# Patient Record
Sex: Female | Born: 1970
Health system: Southern US, Community
[De-identification: ages and names within clinical notes are randomized; demographics above are authoritative.]

## PROBLEM LIST (undated history)

## (undated) DIAGNOSIS — T7840XA Allergy, unspecified, initial encounter: Secondary | ICD-10-CM

## (undated) DIAGNOSIS — T783XXA Angioneurotic edema, initial encounter: Secondary | ICD-10-CM

## (undated) HISTORY — DX: Angioneurotic edema, initial encounter: T78.3XXA

## (undated) HISTORY — DX: Allergy, unspecified, initial encounter: T78.40XA

---

## 2012-11-12 ENCOUNTER — Ambulatory Visit: Payer: Self-pay

## 2012-12-31 ENCOUNTER — Ambulatory Visit: Payer: Self-pay

## 2014-01-27 ENCOUNTER — Ambulatory Visit: Payer: Self-pay

## 2016-08-22 ENCOUNTER — Other Ambulatory Visit: Payer: Self-pay | Admitting: Advanced Practice Midwife

## 2016-08-22 DIAGNOSIS — Z1231 Encounter for screening mammogram for malignant neoplasm of breast: Secondary | ICD-10-CM

## 2016-09-21 ENCOUNTER — Ambulatory Visit
Admission: RE | Admit: 2016-09-21 | Discharge: 2016-09-21 | Disposition: A | Discharge status: Home / Self Care | Payer: 59 | Source: Ambulatory Visit | Attending: Advanced Practice Midwife | Admitting: Advanced Practice Midwife

## 2016-09-21 ENCOUNTER — Encounter: Payer: Self-pay | Admitting: Radiology

## 2016-09-21 DIAGNOSIS — Z1231 Encounter for screening mammogram for malignant neoplasm of breast: Secondary | ICD-10-CM | POA: Diagnosis not present

## 2017-10-14 ENCOUNTER — Ambulatory Visit: Payer: Self-pay | Admitting: Physician Assistant

## 2017-11-01 ENCOUNTER — Ambulatory Visit
Admission: RE | Admit: 2017-11-01 | Discharge: 2017-11-01 | Disposition: A | Discharge status: Home / Self Care | Payer: 59 | Source: Ambulatory Visit | Attending: Physician Assistant | Admitting: Physician Assistant

## 2017-11-01 ENCOUNTER — Ambulatory Visit (INDEPENDENT_AMBULATORY_CARE_PROVIDER_SITE_OTHER): Payer: 59 | Admitting: Physician Assistant

## 2017-11-01 ENCOUNTER — Encounter: Payer: Self-pay | Admitting: Physician Assistant

## 2017-11-01 VITALS — BP 110/80 | HR 92 | Temp 98.6°F | Resp 16 | Ht 59.0 in | Wt 150.0 lb

## 2017-11-01 DIAGNOSIS — Z136 Encounter for screening for cardiovascular disorders: Secondary | ICD-10-CM

## 2017-11-01 DIAGNOSIS — Z1231 Encounter for screening mammogram for malignant neoplasm of breast: Secondary | ICD-10-CM | POA: Diagnosis not present

## 2017-11-01 DIAGNOSIS — Z833 Family history of diabetes mellitus: Secondary | ICD-10-CM

## 2017-11-01 DIAGNOSIS — Z124 Encounter for screening for malignant neoplasm of cervix: Secondary | ICD-10-CM | POA: Diagnosis not present

## 2017-11-01 DIAGNOSIS — Z6830 Body mass index (BMI) 30.0-30.9, adult: Secondary | ICD-10-CM | POA: Diagnosis not present

## 2017-11-01 DIAGNOSIS — A599 Trichomoniasis, unspecified: Secondary | ICD-10-CM

## 2017-11-01 DIAGNOSIS — Z0000 Encounter for general adult medical examination without abnormal findings: Secondary | ICD-10-CM | POA: Diagnosis not present

## 2017-11-01 DIAGNOSIS — Z114 Encounter for screening for human immunodeficiency virus [HIV]: Secondary | ICD-10-CM | POA: Diagnosis not present

## 2017-11-01 DIAGNOSIS — Z1239 Encounter for other screening for malignant neoplasm of breast: Secondary | ICD-10-CM

## 2017-11-01 DIAGNOSIS — Z1329 Encounter for screening for other suspected endocrine disorder: Secondary | ICD-10-CM | POA: Diagnosis not present

## 2017-11-01 DIAGNOSIS — Z13220 Encounter for screening for lipoid disorders: Secondary | ICD-10-CM | POA: Diagnosis not present

## 2017-11-01 NOTE — Patient Instructions (Signed)

## 2017-11-01 NOTE — Progress Notes (Signed)
Patient: Tina Atkinson, Female    DOB: 1970-10-16, 47 y.o.   MRN: [id number] Visit Date: 11/01/2017  Today's Provider: Margaretann Loveless, PA-C   Chief Complaint  Patient presents with  . New Patient (Initial Visit)  . Annual Exam   Subjective:   Patient here to establish care. Patient denies any previous PCP.   Annual physical exam Tina Atkinson is a 47 y.o. female who presents today for health maintenance and complete physical. She feels well. She reports exercising 2 days per week. She reports she is sleeping well.  09/21/16 Mammogram-BI-RADS 1 02/2016 Pap-per pt at [facility] -----------------------------------------------------------------   Review of Systems  Constitutional: Negative.   HENT: Positive for sinus pressure, sneezing and sore throat.   Eyes: Positive for redness and itching.  Respiratory: Positive for cough.   Cardiovascular: Negative.   Gastrointestinal: Negative.   Endocrine: Negative.   Genitourinary: Negative.   Musculoskeletal: Positive for arthralgias.  Skin: Negative.   Allergic/Immunologic: Negative.   Neurological: Positive for light-headedness.  Hematological: Negative.   Psychiatric/Behavioral: Negative.     Social History      She  reports that she has never smoked. She has never used smokeless tobacco.       Social History   Socioeconomic History  . Marital status: Single    Spouse name: Not on file  . Number of children: Not on file  . Years of education: Not on file  . Highest education level: Not on file  Occupational History  . Not on file  Social Needs  . Financial resource strain: Not on file  . Food insecurity:    Worry: Not on file    Inability: Not on file  . Transportation needs:    Medical: Not on file    Non-medical: Not on file  Tobacco Use  . Smoking status: Never Smoker  . Smokeless tobacco: Never Used  Substance and Sexual Activity  . Alcohol use: Not on file  . Drug use: Not on file  . Sexual  activity: Not on file  Lifestyle  . Physical activity:    Days per week: Not on file    Minutes per session: Not on file  . Stress: Not on file  Relationships  . Social connections:    Talks on phone: Not on file    Gets together: Not on file    Attends religious service: Not on file    Active member of club or organization: Not on file    Attends meetings of clubs or organizations: Not on file    Relationship status: Not on file  Other Topics Concern  . Not on file  Social History Narrative  . Not on file    History reviewed. No pertinent past medical history.   There are no active problems to display for this patient.   History reviewed. No pertinent surgical history.  Family History        Family Status  Relation Name Status  . Mother  Alive  . Father  Alive  . MGM  (Not Specified)  . MGF  (Not Specified)  . Brother  Alive  . Brother  Alive  . Neg Hx  (Not Specified)        Her family history includes Cancer in her brother; Diabetes in her maternal grandfather, maternal grandmother, and mother; Hyperlipidemia in her father; Osteoporosis in her mother; Pancreatic cancer in her maternal grandfather. There is no history of Breast cancer.  Allergies  Allergen Reactions  . Penicillin G Hives    No current outpatient medications on file.   Patient Care Team: Margaretann Loveless, PA-C as PCP - General (Family Medicine)      Objective:   Vitals: BP 110/80 (BP Location: Left Arm, Patient Position: Sitting, Cuff Size: Normal)   Pulse 92   Temp 98.6 F (37 C) (Oral)   Resp 16   Ht  (1.499 m)   Wt 150 lb (68 kg)   SpO2 97%   BMI 30.30 kg/m    Vitals:   11/01/17 1021  BP: 110/80  Pulse: 92  Resp: 16  Temp: 98.6 F (37 C)  TempSrc: Oral  SpO2: 97%  Weight: 150 lb (68 kg)  Height:  (1.499 m)     Physical Exam  Constitutional: She is oriented to person, place, and time. She appears well-developed and well-nourished. No distress.    HENT:  Head: Normocephalic and atraumatic.  Right Ear: Hearing, tympanic membrane, external ear and ear canal normal.  Left Ear: Hearing, tympanic membrane, external ear and ear canal normal.  Nose: Nose normal.  Mouth/Throat: Uvula is midline, oropharynx is clear and moist and mucous membranes are normal. No oropharyngeal exudate.  Eyes: Pupils are equal, round, and reactive to light. Conjunctivae and EOM are normal. Right eye exhibits no discharge. Left eye exhibits no discharge. No scleral icterus.  Neck: Normal range of motion. Neck supple. No JVD present. Carotid bruit is not present. No tracheal deviation present. No thyromegaly present.  Cardiovascular: Normal rate, regular rhythm, normal heart sounds and intact distal pulses. Exam reveals no gallop and no friction rub.  No murmur heard. Pulmonary/Chest: Effort normal and breath sounds normal. No respiratory distress. She has no wheezes. She has no rales. She exhibits no tenderness. Right breast exhibits no inverted nipple, no mass, no nipple discharge, no skin change and no tenderness. Left breast exhibits no inverted nipple, no mass, no nipple discharge, no skin change and no tenderness. No breast tenderness, discharge or bleeding. Breasts are symmetrical.  Abdominal: Soft. Bowel sounds are normal. She exhibits no distension and no mass. There is no tenderness. There is no rebound and no guarding. Hernia confirmed negative in the right inguinal area and confirmed negative in the left inguinal area.  Genitourinary: Rectum normal, vagina normal and uterus normal. No breast tenderness, discharge or bleeding. Pelvic exam was performed with patient supine. There is no rash, tenderness, lesion or injury on the right labia. There is no rash, tenderness, lesion or injury on the left labia. Cervix exhibits no motion tenderness, no discharge and no friability. Right adnexum displays no mass, no tenderness and no fullness. Left adnexum displays no mass,  no tenderness and no fullness. No erythema, tenderness or bleeding in the vagina. No signs of injury around the vagina. No vaginal discharge found.  Musculoskeletal: Normal range of motion. She exhibits no edema or tenderness.  Lymphadenopathy:    She has no cervical adenopathy.       Right: No inguinal adenopathy present.       Left: No inguinal adenopathy present.  Neurological: She is alert and oriented to person, place, and time. She has normal reflexes. No cranial nerve deficit. Coordination normal.  Skin: Skin is warm and dry. No rash noted. She is not diaphoretic.  Psychiatric: She has a normal mood and affect. Her behavior is normal. Judgment and thought content normal.  Vitals reviewed.    Depression Screen PHQ 2/9 Scores 11/01/2017  PHQ - 2 Score 0  PHQ- 9 Score 1      Assessment & Plan:     Routine Health Maintenance and Physical Exam  Exercise Activities and Dietary recommendations Goals    None       There is no immunization history on file for this patient.  Health Maintenance  Topic Date Due  . HIV Screening  02/20/1985  . TETANUS/TDAP  02/20/1989  . PAP SMEAR  02/21/1991  . INFLUENZA VACCINE  01/16/2018     Discussed health benefits of physical activity, and encouraged her to engage in regular exercise appropriate for her age and condition.    1. Annual physical exam Normal physical exam today. Will check labs as below and f/u pending lab results. If labs are stable and WNL she will not need to have these rechecked for one year at her next annual physical exam. She is to call the office in the meantime if she has any acute issue, questions or concerns. - CBC with Differential/Platelet - Comprehensive metabolic panel - Lipid Panel With LDL/HDL Ratio - TSH - HgB Z6X  2. Screening for breast cancer Breast exam today was normal. There is no family history of breast cancer. She does perform regular self breast exams. Mammogram was ordered as below.  Information for [facility] clinic was given to patient so she may schedule her mammogram at her convenience. - MM DIGITAL SCREENING BILATERAL; Future  3. Cervical cancer screening Pap collected today. Will send as below and f/u pending results. - Pap IG and HPV (high risk) DNA detection  4. BMI 30.0-30.9,adult Counseled patient on healthy lifestyle modifications including dieting and exercise.  - CBC with Differential/Platelet - Comprehensive metabolic panel - Lipid Panel With LDL/HDL Ratio - HgB A1c  5. Encounter for lipid screening for cardiovascular disease Will check labs as below and f/u pending results. - Lipid Panel With LDL/HDL Ratio  6. Thyroid disorder screening Will check labs as below and f/u pending results. - TSH  7. Family history of diabetes mellitus Will check labs as below and f/u pending results. - HgB A1c  8. Screening for HIV without presence of risk factors - HIV antibody  --------------------------------------------------------------------    Margaretann Loveless, PA-C  [facility]

## 2017-11-02 LAB — CBC WITH DIFFERENTIAL/PLATELET
BASOS: 1 %
Basophils Absolute: 0 10*3/uL (ref 0.0–0.2)
EOS (ABSOLUTE): 0 10*3/uL (ref 0.0–0.4)
EOS: 1 %
HEMATOCRIT: 39.7 % (ref 34.0–46.6)
Hemoglobin: 13.6 g/dL (ref 11.1–15.9)
Immature Grans (Abs): 0 10*3/uL (ref 0.0–0.1)
Immature Granulocytes: 0 %
LYMPHS ABS: 2.6 10*3/uL (ref 0.7–3.1)
Lymphs: 44 %
MCH: 30.7 pg (ref 26.6–33.0)
MCHC: 34.3 g/dL (ref 31.5–35.7)
MCV: 90 fL (ref 79–97)
Monocytes Absolute: 0.3 10*3/uL (ref 0.1–0.9)
Monocytes: 6 %
Neutrophils Absolute: 2.9 10*3/uL (ref 1.4–7.0)
Neutrophils: 48 %
PLATELETS: 341 10*3/uL (ref 150–379)
RBC: 4.43 x10E6/uL (ref 3.77–5.28)
RDW: 13 % (ref 12.3–15.4)
WBC: 5.9 10*3/uL (ref 3.4–10.8)

## 2017-11-02 LAB — HEMOGLOBIN A1C
Est. average glucose Bld gHb Est-mCnc: 111 mg/dL
Hgb A1c MFr Bld: 5.5 % (ref 4.8–5.6)

## 2017-11-02 LAB — COMPREHENSIVE METABOLIC PANEL
A/G RATIO: 1.8 (ref 1.2–2.2)
ALK PHOS: 55 IU/L (ref 39–117)
ALT: 44 IU/L — ABNORMAL HIGH (ref 0–32)
AST: 33 IU/L (ref 0–40)
Albumin: 4.6 g/dL (ref 3.5–5.5)
BILIRUBIN TOTAL: 0.4 mg/dL (ref 0.0–1.2)
BUN/Creatinine Ratio: 9 (ref 9–23)
BUN: 8 mg/dL (ref 6–24)
CHLORIDE: 100 mmol/L (ref 96–106)
CO2: 23 mmol/L (ref 20–29)
Calcium: 10.1 mg/dL (ref 8.7–10.2)
Creatinine, Ser: 0.86 mg/dL (ref 0.57–1.00)
GFR calc Af Amer: 93 mL/min/{1.73_m2} (ref >59)
GFR calc non Af Amer: 81 mL/min/{1.73_m2} (ref >59)
GLOBULIN, TOTAL: 2.5 g/dL (ref 1.5–4.5)
Glucose: 85 mg/dL (ref 65–99)
POTASSIUM: 4.2 mmol/L (ref 3.5–5.2)
SODIUM: 138 mmol/L (ref 134–144)
Total Protein: 7.1 g/dL (ref 6.0–8.5)

## 2017-11-02 LAB — TSH: TSH: 1.96 u[IU]/mL (ref 0.450–4.500)

## 2017-11-02 LAB — LIPID PANEL WITH LDL/HDL RATIO
CHOLESTEROL TOTAL: 179 mg/dL (ref 100–199)
HDL: 48 mg/dL (ref >39)
LDL Calculated: 114 mg/dL — ABNORMAL HIGH (ref 0–99)
LDl/HDL Ratio: 2.4 ratio (ref 0.0–3.2)
Triglycerides: 87 mg/dL (ref 0–149)
VLDL Cholesterol Cal: 17 mg/dL (ref 5–40)

## 2017-11-02 LAB — HIV ANTIBODY (ROUTINE TESTING W REFLEX): HIV SCREEN 4TH GENERATION: NONREACTIVE

## 2017-11-04 ENCOUNTER — Telehealth: Payer: Self-pay

## 2017-11-04 NOTE — Telephone Encounter (Signed)
-----   Message from Margaretann Loveless, PA-C sent at 11/04/2017  9:00 AM EDT ----- All labs are within normal limits and stable.  Thanks! -JB

## 2017-11-04 NOTE — Telephone Encounter (Signed)
Left message to call back  

## 2017-11-04 NOTE — Telephone Encounter (Signed)
-----   Message from Margaretann Loveless, [place] sent at 11/01/2017  4:19 PM EDT ----- Normal mammogram. Repeat screening in one year.

## 2017-11-05 NOTE — Telephone Encounter (Signed)
Patient advised as below.  

## 2017-11-06 LAB — PAP IG AND HPV HIGH-RISK
HPV, high-risk: NEGATIVE
PAP Smear Comment: 0

## 2017-11-06 MED ORDER — METRONIDAZOLE 500 MG PO TABS: 2000.0000 mg | ORAL_TABLET | Freq: Once | ORAL | 0 refills | Status: AC

## 2017-11-06 NOTE — Addendum Note (Signed)
Addended by: Margaretann Loveless on: 11/06/2017 04:42 PM   Modules accepted: Orders

## 2017-11-12 ENCOUNTER — Emergency Department
Admission: EM | Admit: 2017-11-12 | Discharge: 2017-11-12 | Disposition: A | Discharge status: Home / Self Care | Payer: No Typology Code available for payment source | Attending: Student in an Organized Health Care Education/Training Program | Admitting: Student in an Organized Health Care Education/Training Program

## 2017-11-12 ENCOUNTER — Other Ambulatory Visit: Payer: Self-pay

## 2017-11-12 ENCOUNTER — Encounter: Payer: Self-pay | Admitting: Emergency Medicine

## 2017-11-12 DIAGNOSIS — Z77098 Contact with and (suspected) exposure to other hazardous, chiefly nonmedicinal, chemicals: Secondary | ICD-10-CM

## 2017-11-12 DIAGNOSIS — R51 Headache: Secondary | ICD-10-CM | POA: Diagnosis present

## 2017-11-12 DIAGNOSIS — T5991XA Toxic effect of unspecified gases, fumes and vapors, accidental (unintentional), initial encounter: Secondary | ICD-10-CM | POA: Insufficient documentation

## 2017-11-12 DIAGNOSIS — R42 Dizziness and giddiness: Secondary | ICD-10-CM | POA: Diagnosis not present

## 2017-11-12 LAB — BASIC METABOLIC PANEL
Anion gap: 8 (ref 5–15)
BUN: 9 mg/dL (ref 6–20)
CHLORIDE: 101 mmol/L (ref 101–111)
CO2: 26 mmol/L (ref 22–32)
Calcium: 9.5 mg/dL (ref 8.9–10.3)
Creatinine, Ser: 0.96 mg/dL (ref 0.44–1.00)
GFR calc Af Amer: >60 mL/min (ref >60)
GFR calc non Af Amer: >60 mL/min (ref >60)
GLUCOSE: 108 mg/dL — AB (ref 65–99)
POTASSIUM: 3.8 mmol/L (ref 3.5–5.1)
Sodium: 135 mmol/L (ref 135–145)

## 2017-11-12 LAB — CBC
HCT: 40 % (ref 35.0–47.0)
HEMOGLOBIN: 13.9 g/dL (ref 12.0–16.0)
MCH: 31.8 pg (ref 26.0–34.0)
MCHC: 34.9 g/dL (ref 32.0–36.0)
MCV: 91.2 fL (ref 80.0–100.0)
PLATELETS: 288 10*3/uL (ref 150–440)
RBC: 4.38 MIL/uL (ref 3.80–5.20)
RDW: 13.1 % (ref 11.5–14.5)
WBC: 6.1 10*3/uL (ref 3.6–11.0)

## 2017-11-12 NOTE — Discharge Instructions (Signed)
If symptoms return when you go back to work tomorrow notify supervisor and immediate leave the area.

## 2017-11-12 NOTE — ED Notes (Signed)
Tina Atkinson says no uds needed for wc

## 2017-11-12 NOTE — ED Notes (Signed)
See triage note  Presents from work with possible gas exposure  States she became dizzy and had slight headache  States sx's have eased off since arrival to ED

## 2017-11-12 NOTE — ED Notes (Signed)
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  

## 2017-11-12 NOTE — ED Provider Notes (Signed)
[facility] Emergency Department Provider Note   ____________________________________________   First MD Initiated Contact with Patient 11/12/17 1425     (approximate)  I have reviewed the triage vital signs and the nursing notes.   HISTORY  Chief Complaint Toxic Inhalation; Dizziness; and Headache    HPI Tina Atkinson is a 47 y.o. female patient complain of headache and dizziness secondary to toxic inhalation at work.  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  Patient is here with other coworkers from her work site.  History reviewed. No pertinent past medical history.  There are no active problems to display for this patient.   History reviewed. No pertinent surgical history.  Prior to Admission medications   Not on File    Allergies Penicillin g  Family History  Problem Relation Age of Onset  . Diabetes Mother   . Osteoporosis Mother   . Hyperlipidemia Father   . Diabetes Maternal Grandmother   . Diabetes Maternal Grandfather   . Pancreatic cancer Maternal Grandfather   . Cancer Brother   . Breast cancer Neg Hx     Social History Social History   Tobacco Use  . Smoking status: Never Smoker  . Smokeless tobacco: Never Used  Substance Use Topics  . Alcohol use: Not on file  . Drug use: Not on file    Review of Systems Constitutional: No fever/chills Eyes: No visual changes. ENT: No sore throat. Cardiovascular: Denies chest pain. Respiratory: Denies shortness of breath. Gastrointestinal: No abdominal pain.  No nausea, no vomiting.  No diarrhea.  No constipation. Genitourinary: Negative for dysuria. Musculoskeletal: Negative for back pain. Skin: Negative for rash. Neurological: Negative for headaches, focal weakness or numbness. Hematological/Lymphatic: Allergic/Immunilogical: Penicillin.  ____________________________________________   PHYSICAL EXAM:  VITAL SIGNS: ED Triage Vitals    Enc Vitals Group     BP 11/12/17 1356 139/79     Pulse Rate 11/12/17 1356 (!) 102     Resp 11/12/17 1356 14     Temp 11/12/17 1356 98.9 F (37.2 C)     Temp Source 11/12/17 1356 Oral     SpO2 11/12/17 1356 99 %     Weight 11/12/17 1357 150 lb (68 kg)     Height 11/12/17 1357  (1.499 m)     Head Circumference --      Peak Flow --      Pain Score 11/12/17 1357 4     Pain Loc --      Pain Edu? --      Excl. in GC? --    Constitutional: Alert and oriented. Well appearing and in no acute distress. Eyes: Conjunctivae are normal. PERRL. EOMI. Nose: No congestion/rhinnorhea. Mouth/Throat: Mucous membranes are moist.  Oropharynx non-erythematous. Neck: No stridor.   Cardiovascular: Normal rate, regular rhythm. Grossly normal heart sounds.  Good peripheral circulation. Respiratory: Normal respiratory effort.  No retractions. Lungs CTAB. Musculoskeletal: No lower extremity tenderness nor edema.  No joint effusions. Neurologic:  Normal speech and language. No gross focal neurologic deficits are appreciated. No gait instability. Skin:  Skin is warm, dry and intact. No rash noted. Psychiatric: Mood and affect are normal. Speech and behavior are normal.  ____________________________________________   LABS (all labs ordered are listed, but only abnormal results are displayed)  Labs Reviewed  BASIC METABOLIC PANEL - Abnormal; Notable for the following components:      Result Value   Glucose, Bld 108 (*)    All  other components within normal limits  CBC   ____________________________________________  EKG  EKG read by Dr. Roxan Hockey with no acute findings. ____________________________________________  RADIOLOGY    Official radiology report(s): No results found.  ____________________________________________   PROCEDURES  Procedure(s) performed: None  Procedures  Critical Care performed: No  ____________________________________________   INITIAL IMPRESSION /  ASSESSMENT AND PLAN / ED COURSE  As part of my medical decision making, I reviewed the following data within the electronic MEDICAL RECORD NUMBER    Headache and mild vertigo secondary to chemical exposure.  Discussed lab results with patient.  Patient given discharge care instructions.  Patient will follow-up with this department if symptoms return we should go back to work.      ____________________________________________   FINAL CLINICAL IMPRESSION(S) / ED DIAGNOSES  Final diagnoses:  Exposure to chemical inhalation     ED Discharge Orders    None       Note:  This document was prepared using Dragon voice recognition software and may include unintentional dictation errors.    Joni Reining, PA-C 11/12/17 1557    Willy Eddy, MD 11/13/17 [id number]

## 2017-11-12 NOTE — ED Triage Notes (Signed)
Says exposure to something at work along with 8 other patients brought here.  headhache slight, and dizziness.

## 2018-12-10 ENCOUNTER — Other Ambulatory Visit: Payer: Self-pay | Admitting: Physician Assistant

## 2018-12-10 DIAGNOSIS — Z1231 Encounter for screening mammogram for malignant neoplasm of breast: Secondary | ICD-10-CM

## 2019-01-16 ENCOUNTER — Ambulatory Visit
Admission: RE | Admit: 2019-01-16 | Discharge: 2019-01-16 | Disposition: A | Discharge status: Home / Self Care | Payer: 59 | Source: Ambulatory Visit | Attending: Physician Assistant | Admitting: Physician Assistant

## 2019-01-16 ENCOUNTER — Other Ambulatory Visit: Payer: Self-pay

## 2019-01-16 DIAGNOSIS — Z1231 Encounter for screening mammogram for malignant neoplasm of breast: Secondary | ICD-10-CM | POA: Insufficient documentation

## 2019-01-19 ENCOUNTER — Telehealth: Payer: Self-pay

## 2019-01-19 NOTE — Telephone Encounter (Signed)
-----   Message from Mar Daring, [place] sent at 01/16/2019  3:20 PM EDT ----- Normal mammogram. Repeat screening in one year.

## 2019-01-19 NOTE — Telephone Encounter (Signed)
Patient advised as directed below. 

## 2019-06-17 IMAGING — MG MM DIGITAL SCREENING BILAT W/ TOMO W/ CAD
6 of 10 series · 6 of 30 positions shown · non-contrast
Comparison: Previous exam(s).

CLINICAL DATA: Screening.

EXAM:
DIGITAL SCREENING BILATERAL MAMMOGRAM WITH TOMO AND CAD

[L CC synth-2D]
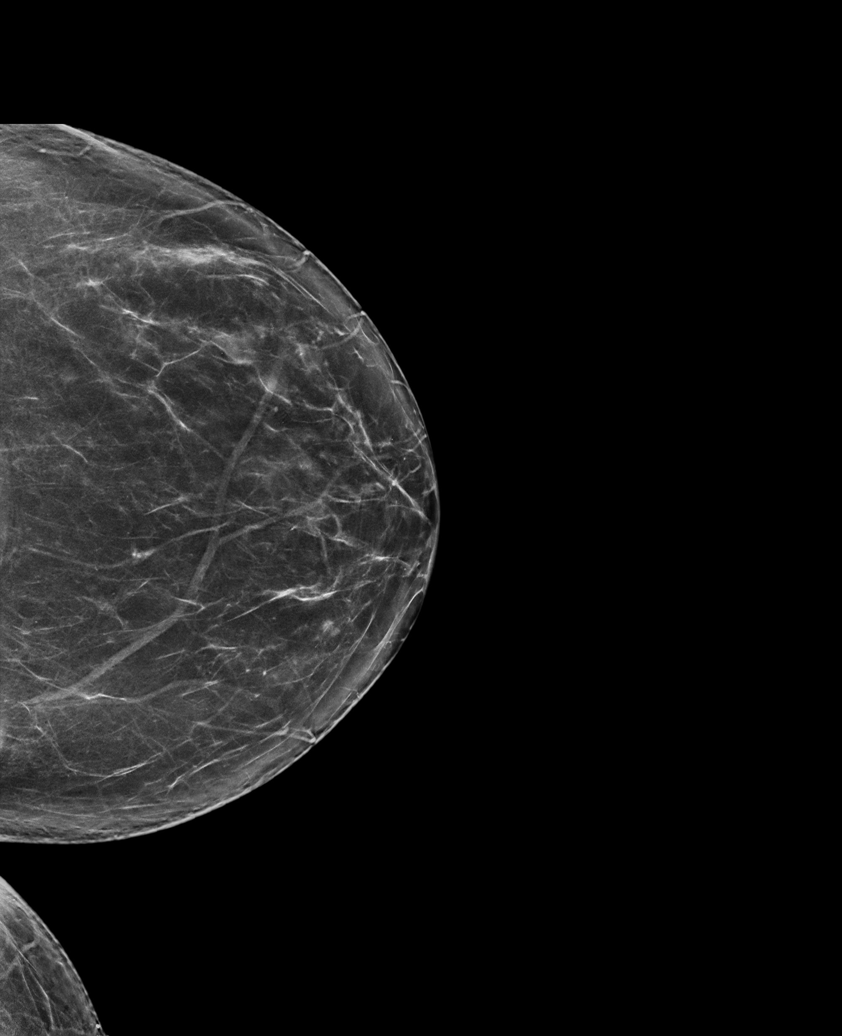

[R MLO synth-2D (1 of 2)]
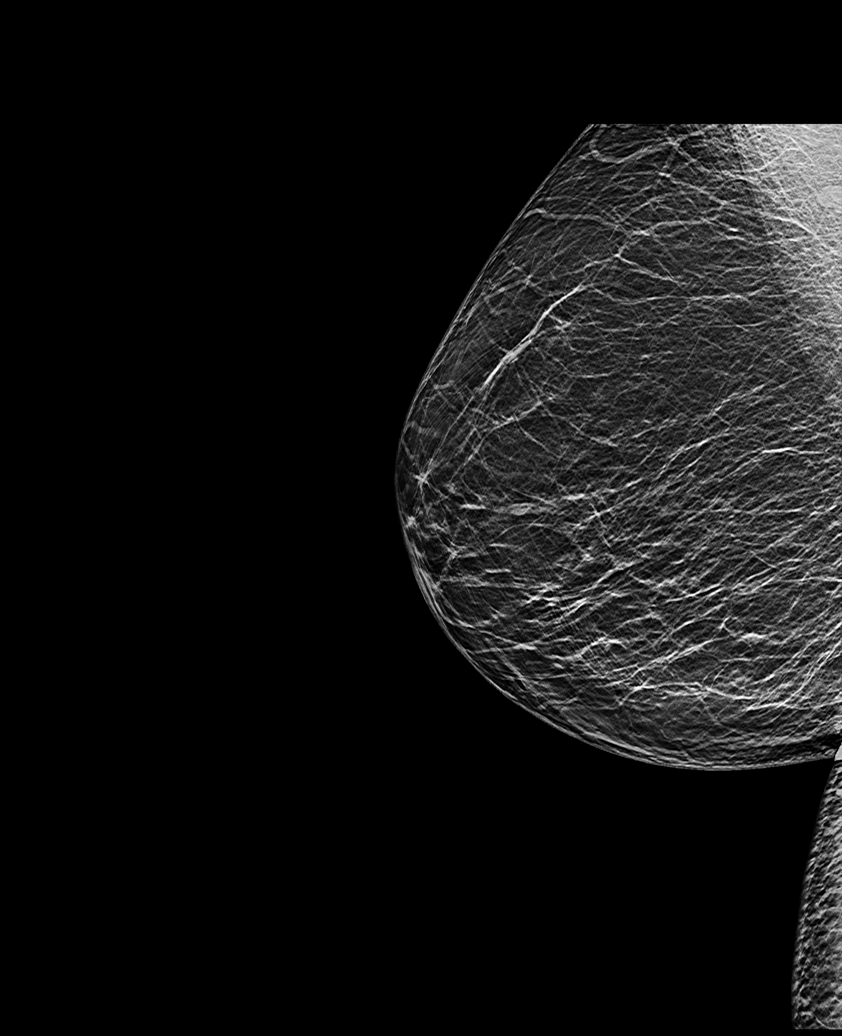

[R CC synth-2D]
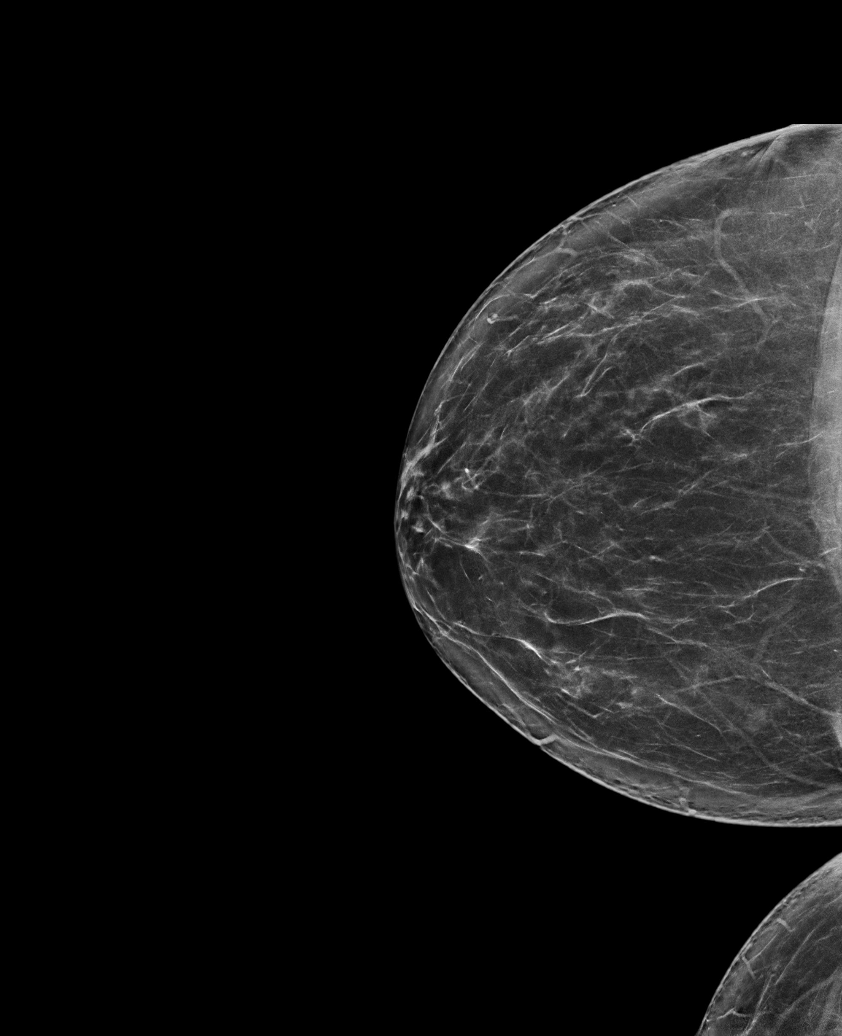

[R MLO synth-2D (2 of 2)]
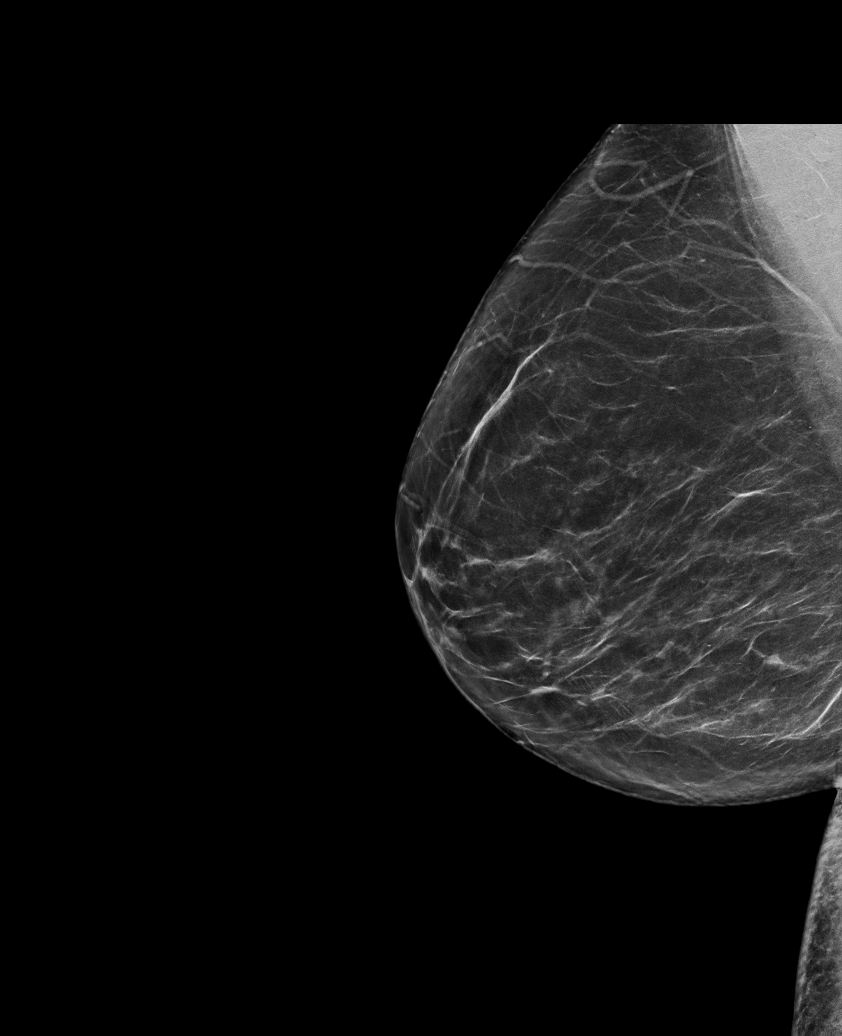

[L MLO synth-2D]
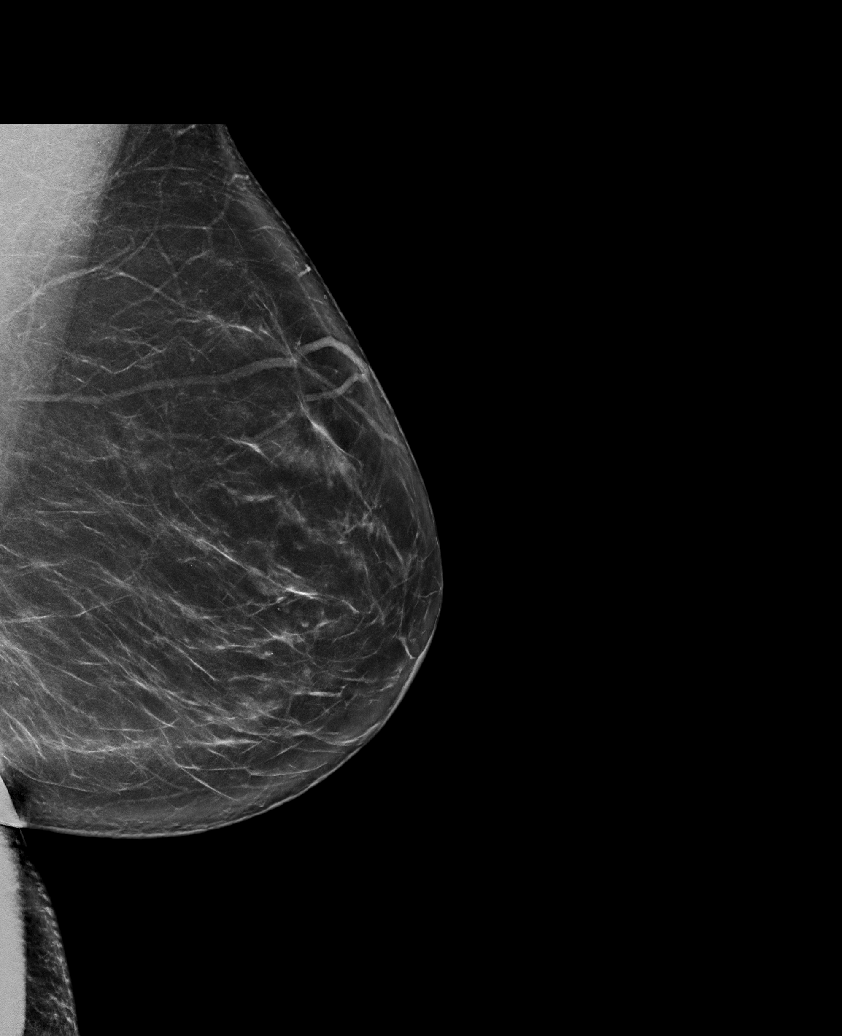

[R CC tomo · tomo slice 39/78.0]
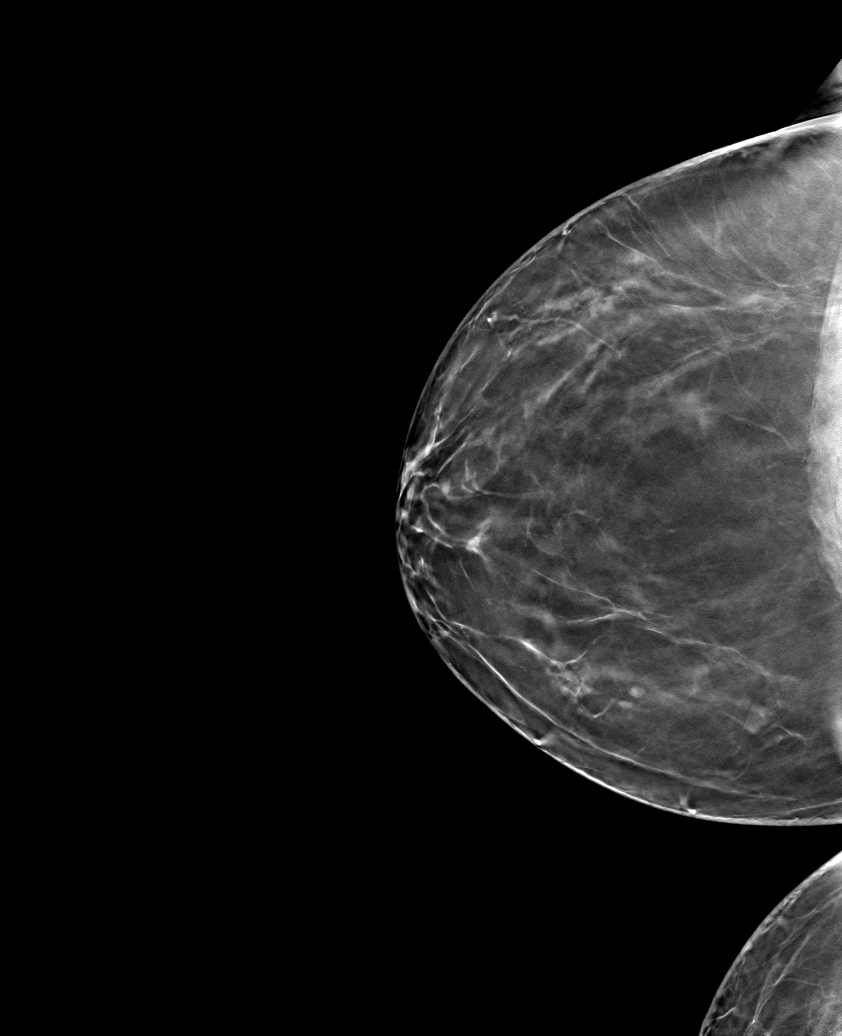

[6 of 30 positions shown; findings below may reference images not displayed]

ACR Breast Density Category b: There are scattered areas of
fibroglandular density.
FINDINGS: There are no findings suspicious for malignancy. Images were
processed with CAD.
IMPRESSION: No mammographic evidence of malignancy. A result letter of this
screening mammogram will be mailed directly to the patient.

RECOMMENDATION:
Screening mammogram in one year. (Code:[id number])

BI-RADS CATEGORY  1: Negative.

## 2019-12-14 ENCOUNTER — Other Ambulatory Visit: Payer: Self-pay | Admitting: Physician Assistant

## 2019-12-14 ENCOUNTER — Telehealth: Payer: Self-pay

## 2019-12-14 ENCOUNTER — Other Ambulatory Visit: Payer: Self-pay

## 2019-12-14 DIAGNOSIS — Z1231 Encounter for screening mammogram for malignant neoplasm of breast: Secondary | ICD-10-CM

## 2019-12-14 NOTE — Telephone Encounter (Signed)
Copied from CRM [id number]. Topic: General - Other >> Dec 14, 2019  8:40 AM Elliot Gault wrote: Reason for CRM: patient requesting annual mamo orders please send to [facility]

## 2020-01-19 ENCOUNTER — Other Ambulatory Visit: Payer: Self-pay

## 2020-01-19 ENCOUNTER — Ambulatory Visit
Admission: RE | Admit: 2020-01-19 | Discharge: 2020-01-19 | Disposition: A | Discharge status: Home / Self Care | Payer: 59 | Source: Ambulatory Visit | Attending: Physician Assistant | Admitting: Physician Assistant

## 2020-01-19 DIAGNOSIS — Z1231 Encounter for screening mammogram for malignant neoplasm of breast: Secondary | ICD-10-CM | POA: Insufficient documentation

## 2021-02-14 ENCOUNTER — Ambulatory Visit: Payer: 59 | Admitting: Family Medicine

## 2021-02-24 ENCOUNTER — Other Ambulatory Visit: Payer: Self-pay

## 2021-02-24 ENCOUNTER — Ambulatory Visit (INDEPENDENT_AMBULATORY_CARE_PROVIDER_SITE_OTHER): Payer: 59 | Admitting: Family Medicine

## 2021-02-24 ENCOUNTER — Other Ambulatory Visit (HOSPITAL_COMMUNITY)
Admission: RE | Admit: 2021-02-24 | Discharge: 2021-02-24 | Disposition: A | Discharge status: Home / Self Care | Payer: 59 | Source: Ambulatory Visit | Attending: Family Medicine | Admitting: Family Medicine

## 2021-02-24 ENCOUNTER — Encounter: Payer: Self-pay | Admitting: Family Medicine

## 2021-02-24 ENCOUNTER — Ambulatory Visit
Admission: RE | Admit: 2021-02-24 | Discharge: 2021-02-24 | Disposition: A | Discharge status: Home / Self Care | Payer: 59 | Source: Ambulatory Visit | Attending: Family Medicine | Admitting: Family Medicine

## 2021-02-24 VITALS — BP 123/83 | HR 90 | Temp 98.1°F | Resp 16 | Ht 59.0 in | Wt 146.9 lb

## 2021-02-24 DIAGNOSIS — E663 Overweight: Secondary | ICD-10-CM

## 2021-02-24 DIAGNOSIS — Z0000 Encounter for general adult medical examination without abnormal findings: Secondary | ICD-10-CM

## 2021-02-24 DIAGNOSIS — R739 Hyperglycemia, unspecified: Secondary | ICD-10-CM

## 2021-02-24 DIAGNOSIS — Z1231 Encounter for screening mammogram for malignant neoplasm of breast: Secondary | ICD-10-CM | POA: Diagnosis present

## 2021-02-24 DIAGNOSIS — Z1211 Encounter for screening for malignant neoplasm of colon: Secondary | ICD-10-CM | POA: Diagnosis not present

## 2021-02-24 DIAGNOSIS — Z23 Encounter for immunization: Secondary | ICD-10-CM

## 2021-02-24 DIAGNOSIS — Z124 Encounter for screening for malignant neoplasm of cervix: Secondary | ICD-10-CM

## 2021-02-24 DIAGNOSIS — Z1159 Encounter for screening for other viral diseases: Secondary | ICD-10-CM

## 2021-02-24 NOTE — Progress Notes (Signed)
Complete physical exam   Patient: Tina Atkinson   DOB: 10/26/1970   50 y.o. Female  MRN: [id number] Visit Date: 02/24/2021  Today's healthcare provider: Shirlee Latch, MD   Chief Complaint  Patient presents with   Annual Exam   Subjective    Tina Atkinson is a 50 y.o. female who presents today for a complete physical exam.  She reports consuming a general diet. The patient does not participate in regular exercise at present. She generally feels well. She reports sleeping well. She does have additional problems to discuss today.   HPI  Skin-tags She wanted to discuss the skin-tags around her neck. They bother her when they get caught on her jewelry   Screenings  11/01/17 Pap/HPV-positive for atypical squamous cells 01/19/20 Mammogram-BI-RADS 1 She is amenable to trying cologuard. She denies family Hx of colon cancer.  Vaccines Declines flu vaccine at this time.  Shingrix 02/24/21  History reviewed. No pertinent past medical history. History reviewed. No pertinent surgical history. Social History   Socioeconomic History   Marital status: Single    Spouse name: Not on file   Number of children: Not on file   Years of education: Not on file   Highest education level: Not on file  Occupational History   Not on file  Tobacco Use   Smoking status: Never   Smokeless tobacco: Never  Vaping Use   Vaping Use: Never used  Substance and Sexual Activity   Alcohol use: Not on file   Drug use: Not on file   Sexual activity: Not on file  Other Topics Concern   Not on file  Social History Narrative   Not on file   Social Determinants of Health   Financial Resource Strain: Not on file  Food Insecurity: Not on file  Transportation Needs: Not on file  Physical Activity: Not on file  Stress: Not on file  Social Connections: Not on file  Intimate Partner Violence: Not on file   Family Status  Relation Name Status   Mother  Alive   Father  Alive   MGM  (Not  Specified)   MGF  (Not Specified)   Brother  Alive   Brother  Alive   Neg Hx  (Not Specified)   Family History  Problem Relation Age of Onset   Diabetes Mother    Osteoporosis Mother    Hyperlipidemia Father    Diabetes Maternal Grandmother    Diabetes Maternal Grandfather    Pancreatic cancer Maternal Grandfather    Cancer Brother    Breast cancer Neg Hx    Allergies  Allergen Reactions   Penicillin G Hives    Patient Care Team: Erasmo Downer, MD as PCP - General (Family Medicine)   Medications: No outpatient medications prior to visit.   No facility-administered medications prior to visit.    Review of Systems  Constitutional:  Negative for chills, diaphoresis, fatigue and fever.  HENT:  Negative for ear pain, sinus pressure, sinus pain and sore throat.   Eyes:  Negative for pain and visual disturbance.  Respiratory:  Negative for apnea, cough, chest tightness, shortness of breath and wheezing.   Cardiovascular:  Negative for chest pain, palpitations and leg swelling.  Gastrointestinal:  Negative for abdominal pain, blood in stool, constipation, diarrhea, nausea and vomiting.  Genitourinary:  Negative for dysuria, flank pain, frequency, pelvic pain and urgency.  Musculoskeletal:  Negative for back pain, myalgias and neck pain.  Neurological:  Negative for dizziness,  seizures, syncope, weakness, light-headedness, numbness and headaches.  All other systems reviewed and are negative.    Objective    BP 123/83 (BP Location: Left Arm, Patient Position: Sitting, Cuff Size: Normal)   Pulse 90   Temp 98.1 F (36.7 C) (Oral)   Resp 16   Ht 4\' 11"  (1.499 m)   Wt 146 lb 14.4 oz (66.6 kg)   LMP 09/17/2016 (Within Days)   BMI 29.67 kg/m    Physical Exam Vitals reviewed.  Constitutional:      General: She is not in acute distress.    Appearance: Normal appearance. She is well-developed. She is not diaphoretic.  HENT:     Head: Normocephalic and atraumatic.      Right Ear: Tympanic membrane, ear canal and external ear normal.     Left Ear: Tympanic membrane, ear canal and external ear normal.     Nose: Nose normal.     Mouth/Throat:     Mouth: Mucous membranes are moist.     Pharynx: Oropharynx is clear. No oropharyngeal exudate.  Eyes:     General: No scleral icterus.    Conjunctiva/sclera: Conjunctivae normal.     Pupils: Pupils are equal, round, and reactive to light.  Neck:     Thyroid: No thyromegaly.  Cardiovascular:     Rate and Rhythm: Normal rate and regular rhythm.     Pulses: Normal pulses.     Heart sounds: Normal heart sounds. No murmur heard. Pulmonary:     Effort: Pulmonary effort is normal. No respiratory distress.     Breath sounds: Normal breath sounds. No wheezing or rales.  Chest:     Comments: Breasts: breasts appear normal, no suspicious masses, no skin or nipple changes or axillary nodes  Abdominal:     General: There is no distension.     Palpations: Abdomen is soft.     Tenderness: There is no abdominal tenderness.  Genitourinary:    Comments: GYN:  External genitalia within normal limits.  Vaginal mucosa pink, moist, normal rugae.  Nonfriable cervix without lesions, no discharge or bleeding noted on speculum exam.   Musculoskeletal:        General: No deformity.     Cervical back: Neck supple.     Right lower leg: No edema.     Left lower leg: No edema.  Lymphadenopathy:     Cervical: No cervical adenopathy.  Skin:    General: Skin is warm and dry.     Findings: No rash.  Neurological:     Mental Status: She is alert and oriented to person, place, and time. Mental status is at baseline.     Sensory: No sensory deficit.     Motor: No weakness.     Gait: Gait normal.  Psychiatric:        Mood and Affect: Mood normal.        Behavior: Behavior normal.        Thought Content: Thought content normal.      Last depression screening scores PHQ 2/9 Scores 02/24/2021 11/01/2017  PHQ - 2 Score 0 0  PHQ- 9  Score 0 1   Last fall risk screening Fall Risk  02/24/2021  Falls in the past year? 0  Number falls in past yr: 0  Injury with Fall? 0  Risk for fall due to : No Fall Risks  Follow up Falls evaluation completed   Last Audit-C alcohol use screening Alcohol Use Disorder Test (AUDIT) 02/24/2021  1. How  often do you have a drink containing alcohol? 1  2. How many drinks containing alcohol do you have on a typical day when you are drinking? 0  3. How often do you have six or more drinks on one occasion? 0  AUDIT-C Score 1   A score of 3 or more in women, and 4 or more in men indicates increased risk for alcohol abuse, EXCEPT if all of the points are from question 1   No results found for any visits on 02/24/21.  Assessment & Plan     Problem List Items Addressed This Visit       Other   Overweight    Discussed importance of healthy weight management Discussed diet and exercise       Other Visit Diagnoses     Encounter for annual health examination    -  Primary   Relevant Orders   Comprehensive metabolic panel   Lipid Panel With LDL/HDL Ratio   Hepatitis C Antibody   Cologuard   Hemoglobin A1c   Cervical cancer screening       Relevant Orders   Cytology - PAP   Screening mammogram, encounter for       Relevant Orders   MM 3D SCREEN BREAST BILATERAL   Colon cancer screening       Relevant Orders   Cologuard   Need for hepatitis C screening test       Relevant Orders   Hepatitis C Antibody   Hyperglycemia       Relevant Orders   Hemoglobin A1c       Routine Health Maintenance and Physical Exam  Exercise Activities and Dietary recommendations  Goals   None     Immunization History  Administered Date(s) Administered   Influenza,inj,Quad PF,6+ Mos 05/03/2017   Janssen (J&J) SARS-COV-2 Vaccination 09/24/2019   Moderna SARS-COV2 Booster Vaccination 04/12/2020    Health Maintenance  Topic Date Due   Hepatitis C Screening  Never done   TETANUS/TDAP  Never  done   COLONOSCOPY (Pts 45-58yrs Insurance coverage will need to be confirmed)  Never done   Zoster Vaccines- Shingrix (1 of 2) Never done   COVID-19 Vaccine (3 - Booster for Janssen series) 08/13/2020   PAP SMEAR-Modifier  11/01/2020   INFLUENZA VACCINE  09/15/2021 (Originally 01/16/2021)   MAMMOGRAM  01/18/2022   HIV Screening  Completed   Pneumococcal Vaccine 65-6 Years old  Aged Out   HPV VACCINES  Aged Out    Discussed health benefits of physical activity, and encouraged her to engage in regular exercise appropriate for her age and condition.    Return in about 1 year (around 02/24/2022) for CPE.     I,Essence Turner,acting as a Neurosurgeon for Shirlee Latch, MD.,have documented all relevant documentation on the behalf of Shirlee Latch, MD,as directed by  Shirlee Latch, MD while in the presence of Shirlee Latch, MD.  I, Shirlee Latch, MD, have reviewed all documentation for this visit. The documentation on 02/24/21 for the exam, diagnosis, procedures, and orders are all accurate and complete.   Anginette Espejo, Marzella Schlein, MD, MPH [facility]

## 2021-02-24 NOTE — Patient Instructions (Addendum)
  Also consider TDAP and COVID booster

## 2021-02-24 NOTE — Assessment & Plan Note (Signed)
Discussed importance of healthy weight management Discussed diet and exercise  

## 2021-02-24 NOTE — Addendum Note (Signed)
Addended by: Hyacinth Meeker on: 02/24/2021 10:41 AM   Modules accepted: Orders

## 2021-02-25 LAB — COMPREHENSIVE METABOLIC PANEL
ALT: 39 IU/L — ABNORMAL HIGH (ref 0–32)
AST: 27 IU/L (ref 0–40)
Albumin/Globulin Ratio: 1.8 (ref 1.2–2.2)
Albumin: 4.7 g/dL (ref 3.8–4.9)
Alkaline Phosphatase: 70 IU/L (ref 44–121)
BUN/Creatinine Ratio: 8 — ABNORMAL LOW (ref 9–23)
BUN: 7 mg/dL (ref 6–24)
Bilirubin Total: 0.4 mg/dL (ref 0.0–1.2)
CO2: 26 mmol/L (ref 20–29)
Calcium: 10.4 mg/dL — ABNORMAL HIGH (ref 8.7–10.2)
Chloride: 101 mmol/L (ref 96–106)
Creatinine, Ser: 0.92 mg/dL (ref 0.57–1.00)
Globulin, Total: 2.6 g/dL (ref 1.5–4.5)
Glucose: 102 mg/dL — ABNORMAL HIGH (ref 65–99)
Potassium: 4.6 mmol/L (ref 3.5–5.2)
Sodium: 139 mmol/L (ref 134–144)
Total Protein: 7.3 g/dL (ref 6.0–8.5)
eGFR: 75 mL/min/{1.73_m2} (ref >59)

## 2021-02-25 LAB — LIPID PANEL WITH LDL/HDL RATIO
Cholesterol, Total: 221 mg/dL — ABNORMAL HIGH (ref 100–199)
HDL: 46 mg/dL (ref >39)
LDL Chol Calc (NIH): 141 mg/dL — ABNORMAL HIGH (ref 0–99)
LDL/HDL Ratio: 3.1 ratio (ref 0.0–3.2)
Triglycerides: 187 mg/dL — ABNORMAL HIGH (ref 0–149)
VLDL Cholesterol Cal: 34 mg/dL (ref 5–40)

## 2021-02-25 LAB — HEMOGLOBIN A1C
Est. average glucose Bld gHb Est-mCnc: 117 mg/dL
Hgb A1c MFr Bld: 5.7 % — ABNORMAL HIGH (ref 4.8–5.6)

## 2021-02-25 LAB — HEPATITIS C ANTIBODY: Hep C Virus Ab: <0.1 s/co ratio (ref 0.0–0.9)

## 2021-03-02 ENCOUNTER — Telehealth: Payer: Self-pay

## 2021-03-02 LAB — CYTOLOGY - PAP
Comment: NEGATIVE
Diagnosis: NEGATIVE
High risk HPV: NEGATIVE

## 2021-03-02 NOTE — Telephone Encounter (Signed)
Copied from CRM [id number]. Topic: General - Other >> Mar 02, 2021  9:09 AM Traci Sermon wrote: Reason for CRM: Pt called in to return a call, I did not see any notes or messages as to who and why, but pt requested if someone give her a call back. Please advise.

## 2021-03-02 NOTE — Telephone Encounter (Signed)
Patient advised of normal mammogram and pap.

## 2021-03-12 LAB — COLOGUARD: Cologuard: NEGATIVE

## 2021-04-27 ENCOUNTER — Other Ambulatory Visit: Payer: Self-pay

## 2021-04-27 ENCOUNTER — Ambulatory Visit (INDEPENDENT_AMBULATORY_CARE_PROVIDER_SITE_OTHER): Payer: 59 | Admitting: Family Medicine

## 2021-04-27 DIAGNOSIS — Z23 Encounter for immunization: Secondary | ICD-10-CM

## 2021-04-28 ENCOUNTER — Encounter: Payer: Self-pay | Admitting: Family Medicine

## 2021-04-28 LAB — BASIC METABOLIC PANEL
BUN/Creatinine Ratio: 9 (ref 9–23)
BUN: 8 mg/dL (ref 6–24)
CO2: 25 mmol/L (ref 20–29)
Calcium: 10.3 mg/dL — ABNORMAL HIGH (ref 8.7–10.2)
Chloride: 101 mmol/L (ref 96–106)
Creatinine, Ser: 0.9 mg/dL (ref 0.57–1.00)
Glucose: 85 mg/dL (ref 70–99)
Potassium: 4 mmol/L (ref 3.5–5.2)
Sodium: 140 mmol/L (ref 134–144)
eGFR: 77 mL/min/{1.73_m2} (ref >59)

## 2021-04-28 NOTE — Progress Notes (Signed)
Patient here for shingrix vaccination only.  I did not examine the patient.  I did review her medical history, medications, and allergies and vaccine consent form.  CMA gave vaccination. Patient tolerated well.  Erasmo Downer, MD, MPH [facility] 04/28/2021 7:54 AM

## 2021-05-01 ENCOUNTER — Telehealth: Payer: Self-pay

## 2021-05-01 NOTE — Telephone Encounter (Signed)
Patient aware. Labs ordered. Place up front.

## 2021-05-01 NOTE — Telephone Encounter (Signed)
-----   Message from Erasmo Downer, MD sent at 04/28/2021  8:11 AM EST ----- Calcium is still slightly elevated, but improving.  Continue to hold Calcium supplements and hydrate well and recheck in 2 months.

## 2022-02-26 ENCOUNTER — Encounter: Payer: 59 | Admitting: Family Medicine

## 2022-03-28 ENCOUNTER — Telehealth: Payer: Self-pay

## 2022-03-28 DIAGNOSIS — Z1231 Encounter for screening mammogram for malignant neoplasm of breast: Secondary | ICD-10-CM

## 2022-03-28 NOTE — Telephone Encounter (Signed)
Referral for mammogram

## 2022-04-04 ENCOUNTER — Other Ambulatory Visit: Payer: Self-pay | Admitting: Family Medicine

## 2022-04-04 DIAGNOSIS — Z1231 Encounter for screening mammogram for malignant neoplasm of breast: Secondary | ICD-10-CM

## 2022-05-07 ENCOUNTER — Ambulatory Visit
Admission: RE | Admit: 2022-05-07 | Discharge: 2022-05-07 | Disposition: A | Discharge status: Home / Self Care | Payer: Managed Care, Other (non HMO) | Source: Ambulatory Visit | Attending: Family Medicine | Admitting: Family Medicine

## 2022-05-07 DIAGNOSIS — Z1231 Encounter for screening mammogram for malignant neoplasm of breast: Secondary | ICD-10-CM | POA: Insufficient documentation

## 2022-05-09 NOTE — Progress Notes (Signed)
Hi Lindee  Normal mammogram; repeat in 1 year.  Please let us know if you have any questions.  Thank you,  Merita Norton, FNP

## 2022-06-26 ENCOUNTER — Ambulatory Visit (INDEPENDENT_AMBULATORY_CARE_PROVIDER_SITE_OTHER): Payer: Managed Care, Other (non HMO) | Admitting: Family Medicine

## 2022-06-26 ENCOUNTER — Encounter: Payer: Self-pay | Admitting: Family Medicine

## 2022-06-26 VITALS — BP 136/87 | HR 80 | Temp 98.5°F | Wt 150.1 lb

## 2022-06-26 DIAGNOSIS — Z0000 Encounter for general adult medical examination without abnormal findings: Secondary | ICD-10-CM | POA: Diagnosis not present

## 2022-06-26 DIAGNOSIS — R7303 Prediabetes: Secondary | ICD-10-CM

## 2022-06-26 DIAGNOSIS — L301 Dyshidrosis [pompholyx]: Secondary | ICD-10-CM | POA: Diagnosis not present

## 2022-06-26 MED ORDER — TRIAMCINOLONE ACETONIDE 0.5 % EX OINT: 1.0000 | TOPICAL_OINTMENT | Freq: Two times a day (BID) | CUTANEOUS | 0 refills | Status: DC

## 2022-06-26 NOTE — Assessment & Plan Note (Signed)
Recommend low carb diet °Recheck A1c  °

## 2022-06-26 NOTE — Progress Notes (Signed)
Complete physical exam  Patient: Tina Atkinson   DOB: [birth year]   52 y.o. Female  MRN: [id number]  Subjective:    Chief Complaint  Patient presents with   Annual Exam    Tina Atkinson is a 52 y.o. female who presents today for a complete physical exam. She reports consuming a general diet. Walking daily. She generally feels well. She reports sleeping well. She does have additional problems to discuss today. Rash on hands and wrist or several weeks.   Most recent fall risk assessment:    06/26/2022    9:18 AM  Fall Risk   Falls in the past year? 0  Number falls in past yr: 0  Injury with Fall? 0     Most recent depression screenings:    06/26/2022    9:19 AM 06/26/2022    9:18 AM  PHQ 2/9 Scores  PHQ - 2 Score 0 0  PHQ- 9 Score 0         Patient Care Team: Erasmo Downer, MD as PCP - General (Family Medicine)   No outpatient medications prior to visit.   No facility-administered medications prior to visit.    Review of Systems  Skin:  Positive for rash.  All other systems reviewed and are negative.      Objective:     BP 136/87   Pulse 80   Temp 98.5 F (36.9 C)   Wt 150 lb 1.6 oz (68.1 kg)   LMP 09/17/2016 (Within Days)   SpO2 100%   BMI 30.32 kg/m    Physical Exam Vitals reviewed.  Constitutional:      General: She is not in acute distress.    Appearance: Normal appearance. She is well-developed. She is not diaphoretic.  HENT:     Head: Normocephalic and atraumatic.     Right Ear: Tympanic membrane, ear canal and external ear normal.     Left Ear: Tympanic membrane, ear canal and external ear normal.     Nose: Nose normal.     Mouth/Throat:     Mouth: Mucous membranes are moist.     Pharynx: Oropharynx is clear. No oropharyngeal exudate.  Eyes:     General: No scleral icterus.    Conjunctiva/sclera: Conjunctivae normal.     Pupils: Pupils are equal, round, and reactive to light.  Neck:     Thyroid: No thyromegaly.   Cardiovascular:     Rate and Rhythm: Normal rate and regular rhythm.     Pulses: Normal pulses.     Heart sounds: Normal heart sounds. No murmur heard. Pulmonary:     Effort: Pulmonary effort is normal. No respiratory distress.     Breath sounds: Normal breath sounds. No wheezing or rales.  Abdominal:     General: There is no distension.     Palpations: Abdomen is soft.     Tenderness: There is no abdominal tenderness.  Musculoskeletal:        General: No deformity.     Cervical back: Neck supple.     Right lower leg: No edema.     Left lower leg: No edema.  Lymphadenopathy:     Cervical: No cervical adenopathy.  Skin:    General: Skin is warm and dry.     Findings: Rash (erythematous, b/l hands and wrists) present.  Neurological:     Mental Status: She is alert and oriented to person, place, and time. Mental status is at baseline.     Sensory: No sensory deficit.  Motor: No weakness.     Gait: Gait normal.  Psychiatric:        Mood and Affect: Mood normal.        Behavior: Behavior normal.        Thought Content: Thought content normal.      No results found for any visits on 06/26/22.     Assessment & Plan:    Routine Health Maintenance and Physical Exam  Immunization History  Administered Date(s) Administered   COVID-19, mRNA, vaccine(Comirnaty)12 years and older 04/20/2022   DTaP 05/05/1970, 02/07/1971, 04/10/1973   Influenza,inj,Quad PF,6+ Mos 05/03/2017, 02/24/2021   Influenza-Unspecified 03/28/2022   Janssen (J&J) SARS-COV-2 Vaccination 09/24/2019   Moderna SARS-COV2 Booster Vaccination 04/12/2020, 03/21/2021   Mumps 06/29/1974   OPV 05/05/1970, 02/04/1971, 04/07/1973, 06/29/1974   Rubella 06/29/1974   Zoster Recombinat (Shingrix) 02/24/2021, 04/27/2021    Health Maintenance  Topic Date Due   DTaP/Tdap/Td (4 - Tdap) 02/20/1989   Fecal DNA (Cologuard)  03/06/2024   MAMMOGRAM  05/07/2024   PAP SMEAR-Modifier  02/24/2026   INFLUENZA VACCINE   Completed   COVID-19 Vaccine  Completed   Hepatitis C Screening  Completed   HIV Screening  Completed   Zoster Vaccines- Shingrix  Completed   HPV VACCINES  Aged Out    Discussed health benefits of physical activity, and encouraged her to engage in regular exercise appropriate for her age and condition.  Problem List Items Addressed This Visit       Musculoskeletal and Integument   Dyshidrotic eczema    New problem Lots of handwashing and hand sanitizer use recently Advised moisturizing well  Triamcinolone BID prn        Other   Prediabetes    Recommend low carb diet Recheck A1c       Relevant Orders   Hemoglobin A1c   Lipid panel   Comprehensive metabolic panel   Other Visit Diagnoses     Encounter for annual physical exam    -  Primary   Relevant Orders   Hemoglobin A1c   Lipid panel   Comprehensive metabolic panel      Return in about 1 year (around 06/27/2023) for CPE.     Lavon Paganini, MD

## 2022-06-26 NOTE — Assessment & Plan Note (Signed)
New problem Lots of handwashing and hand sanitizer use recently Advised moisturizing well  Triamcinolone BID prn

## 2022-06-27 LAB — COMPREHENSIVE METABOLIC PANEL
ALT: 34 IU/L — ABNORMAL HIGH (ref 0–32)
AST: 26 IU/L (ref 0–40)
Albumin/Globulin Ratio: 2 (ref 1.2–2.2)
Albumin: 4.8 g/dL (ref 3.8–4.9)
Alkaline Phosphatase: 63 IU/L (ref 44–121)
BUN/Creatinine Ratio: 9 (ref 9–23)
BUN: 8 mg/dL (ref 6–24)
Bilirubin Total: 0.4 mg/dL (ref 0.0–1.2)
CO2: 25 mmol/L (ref 20–29)
Calcium: 10.6 mg/dL — ABNORMAL HIGH (ref 8.7–10.2)
Chloride: 100 mmol/L (ref 96–106)
Creatinine, Ser: 0.88 mg/dL (ref 0.57–1.00)
Globulin, Total: 2.4 g/dL (ref 1.5–4.5)
Glucose: 92 mg/dL (ref 70–99)
Potassium: 4.6 mmol/L (ref 3.5–5.2)
Sodium: 139 mmol/L (ref 134–144)
Total Protein: 7.2 g/dL (ref 6.0–8.5)
eGFR: 79 mL/min/{1.73_m2} (ref >59)

## 2022-06-27 LAB — HEMOGLOBIN A1C
Est. average glucose Bld gHb Est-mCnc: 120 mg/dL
Hgb A1c MFr Bld: 5.8 % — ABNORMAL HIGH (ref 4.8–5.6)

## 2022-06-27 LAB — LIPID PANEL
Chol/HDL Ratio: 4.6 ratio — ABNORMAL HIGH (ref 0.0–4.4)
Cholesterol, Total: 212 mg/dL — ABNORMAL HIGH (ref 100–199)
HDL: 46 mg/dL (ref >39)
LDL Chol Calc (NIH): 145 mg/dL — ABNORMAL HIGH (ref 0–99)
Triglycerides: 116 mg/dL (ref 0–149)
VLDL Cholesterol Cal: 21 mg/dL (ref 5–40)

## 2022-11-08 ENCOUNTER — Emergency Department
Admission: EM | Admit: 2022-11-08 | Discharge: 2022-11-08 | Disposition: A | Discharge status: Home / Self Care | Payer: Managed Care, Other (non HMO) | Attending: Emergency Medicine | Admitting: Emergency Medicine

## 2022-11-08 ENCOUNTER — Other Ambulatory Visit: Payer: Self-pay

## 2022-11-08 DIAGNOSIS — T783XXA Angioneurotic edema, initial encounter: Secondary | ICD-10-CM | POA: Diagnosis not present

## 2022-11-08 DIAGNOSIS — R220 Localized swelling, mass and lump, head: Secondary | ICD-10-CM | POA: Diagnosis present

## 2022-11-08 MED ORDER — DIPHENHYDRAMINE HCL 50 MG/ML IJ SOLN
INTRAMUSCULAR | Status: AC
  Administered 2022-11-08: 50 mg
  Filled 2022-11-08: qty 1

## 2022-11-08 MED ORDER — EPINEPHRINE 0.3 MG/0.3ML IJ SOAJ
0.3000 mg | Freq: Once | INTRAMUSCULAR | Status: AC
  Administered 2022-11-08: 0.3 mg via INTRAMUSCULAR

## 2022-11-08 MED ORDER — PREDNISONE 20 MG PO TABS: 40.0000 mg | ORAL_TABLET | Freq: Every day | ORAL | 0 refills | Status: AC

## 2022-11-08 MED ORDER — LACTATED RINGERS IV BOLUS
1000.0000 mL | Freq: Once | INTRAVENOUS | Status: AC
  Administered 2022-11-08: 1000 mL via INTRAVENOUS

## 2022-11-08 MED ORDER — FAMOTIDINE IN NACL 20-0.9 MG/50ML-% IV SOLN
INTRAVENOUS | Status: AC
  Administered 2022-11-08: 20 mg
  Filled 2022-11-08: qty 50

## 2022-11-08 MED ORDER — METHYLPREDNISOLONE SODIUM SUCC 125 MG IJ SOLR
INTRAMUSCULAR | Status: AC
  Administered 2022-11-08: 125 mg
  Filled 2022-11-08: qty 2

## 2022-11-08 NOTE — ED Provider Notes (Signed)
Patient has been observed for several hours.  I first saw her at about 9:00 this morning and she had mild angioedema of her lower lip and cheeks.  On reassessment now at 1130 this is regressed and her lip appears quite normal suggestive of very mild edema in her lower cheek area.  Patient reports she feels well feels like all swelling has abated.  She is resting quite comfortably at this time airway is widely patent tongue is normal work of breathing normal lungs are clear  Discussed with patient and her mother, she will trial over-the-counter cetirizine for approximately 1 week, also place her on prednisone for 4 days, and she will follow-up with an allergist.  As well her primary care doctor.  Very careful return precautions advised especially those relevant to angioedema which I discussed with her as well as her mother  Return precautions and treatment recommendations and follow-up discussed with the patient who is agreeable with the plan.    Sharyn Creamer, MD 11/08/22 [phone]

## 2022-11-08 NOTE — ED Notes (Signed)
Pt is awake and alert, resting on stretcher at this time with family at bedside. Respirations are even and unlabored, skin AfE/WD. There is swelling noted to bottom lip, pt endorsing improvement after medication administration. Pt denies shortness of breath, throat swelling, or chest pain. Pt maintaining secretions at this time without difficulty and speaking in clear and complete sentences. VSS, NAD. Pt denies any needs at this time. Call light is in reach, pt connected to monitoring. WCTM.

## 2022-11-08 NOTE — ED Notes (Signed)
Swelling to bottom lip has decreased on assessment, pt agreeable with this. Pt resting comfortably with family at bedside. NAD, WCTM. Call light in reach.

## 2022-11-08 NOTE — ED Triage Notes (Signed)
Acute onset lip swelling 0100 this AM upon waking. Denies known allergens. Denies taking any medications. Denies prior allergic reactions. Pt speaking in full sentences and swallowing secretions with ease. Pt alert and oriented. MD at bedside.

## 2022-11-08 NOTE — Discharge Instructions (Addendum)
You have angioedema but we are not exactly sure what the causes.  Avoid taking any NSAIDs such as ibuprofen Motrin or Aleve for now.  Please follow-up with an allergist.  If you develop any recurrent or worsening swelling then please return to the emergency department.

## 2022-11-08 NOTE — ED Provider Notes (Signed)
[facility] Provider Note    Event Date/Time   First MD Initiated Contact with Patient 11/08/22 [phone]     (approximate)   History   Angioedema   HPI  Tina Atkinson is a 52 y.o. female with no significant past medical history who presents with lip swelling.  Patient woke up around 1:30 AM feeling like the bottom lip was swollen.  Over about 5 hours the symptoms worsen.  She denies any sensation of tongue swelling or difficulty swallowing or voice changes.  Does endorse some itching on her face and hands.  No rash.  Denies nausea vomiting.  Denies any new medications or new exposures.  Says that she has had several episodes of lip swelling the past but never this significant.  She is not on any medications consistently.  Does take NSAIDs occasionally.     History reviewed. No pertinent past medical history.  Patient Active Problem List   Diagnosis Date Noted   Prediabetes 06/26/2022   Dyshidrotic eczema 06/26/2022     Physical Exam  Triage Vital Signs: ED Triage Vitals  Enc Vitals Group     BP 11/08/22 0612 (!) 154/85     Pulse Rate 11/08/22 0612 (!) 121     Resp 11/08/22 0612 (!) 34     Temp 11/08/22 0612 98.7 F (37.1 C)     Temp Source 11/08/22 0612 Oral     SpO2 11/08/22 0612 100 %     Weight 11/08/22 0612 147 lb (66.7 kg)     Height 11/08/22 0612 4\' 11"  (1.499 m)     Head Circumference --      Peak Flow --      Pain Score 11/08/22 0611 0     Pain Loc --      Pain Edu? --      Excl. in GC? --     Most recent vital signs: Vitals:   11/08/22 0612  BP: (!) 154/85  Pulse: (!) 121  Resp: (!) 34  Temp: 98.7 F (37.1 C)  SpO2: 100%     General: Awake, no distress.  CV:  Good peripheral perfusion.  Resp:  Normal effort.  Abd:  No distention.  Neuro:             Awake, Alert, Oriented x 3  Other:  Patient has significant swelling of the bilateral lower lip and jaw region Tongue is nonswollen no elevation of the floor the  mouth Posterior oropharynx Patient is not in respiratory distress with normal voice no stridor tolerating secretions   ED Results / Procedures / Treatments  Labs (all labs ordered are listed, but only abnormal results are displayed) Labs Reviewed - No data to display   EKG     RADIOLOGY    PROCEDURES:  Critical Care performed: Yes, see critical care procedure note(s)  .Critical Care  Performed by: Georga Hacking, MD Authorized by: Georga Hacking, MD   Critical care provider statement:    Critical care time (minutes):  30   Critical care was time spent personally by me on the following activities:  Development of treatment plan with patient or surrogate, discussions with consultants, evaluation of patient's response to treatment, examination of patient, ordering and review of laboratory studies, ordering and review of radiographic studies, ordering and performing treatments and interventions, pulse oximetry, re-evaluation of patient's condition and review of old charts   The patient is on the cardiac monitor to evaluate for evidence of arrhythmia and/or  significant heart rate changes.   MEDICATIONS ORDERED IN ED: Medications  lactated ringers bolus 1,000 mL (has no administration in time range)  diphenhydrAMINE (BENADRYL) 50 MG/ML injection (50 mg  Given 11/08/22 0615)  famotidine (PEPCID) 20-0.9 MG/50ML-% IVPB (20 mg  New Bag/Given 11/08/22 0616)  methylPREDNISolone sodium succinate (SOLU-MEDROL) 125 mg/2 mL injection (125 mg  Given 11/08/22 0615)  EPINEPHrine (EPI-PEN) injection 0.3 mg (0.3 mg Intramuscular Given 11/08/22 0615)     IMPRESSION / MDM / ASSESSMENT AND PLAN / ED COURSE  I reviewed the triage vital signs and the nursing notes.                              Patient's presentation is most consistent with acute presentation with potential threat to life or bodily function.  Differential diagnosis includes, but is not limited to, hereditary angioedema,  idiopathic angioedema, NSAID induced angioedema, anaphylaxis  The patient is a 52 year old female who presents with lower lip swelling.  She woke up with this around 1:30 AM and it is slowly progressed over period of several hours.  On arrival she was noted to have significant lower lip and jaw swelling but there is no trismus the tongue is not swollen she has normal voice without difficulty swallowing or tolerating secretions and she is not stridulous or in any respiratory distress.  The angioedema appears to be limited to the front of the face and lips.  She tells me she has had prior episodes of lip swelling or other minor.  She is not on any ACE or ARB.  Does take NSAIDs occasionally.  She does not have any urticaria or rash but does endorse itching of her face and her hands which could suggest this is allergic in nature.  Plan to treat with epinephrine Pepcid Benadryl and Solu-Medrol and reassess.  Did consider giving C1 esterase concentrate upfront but given this is not involving her airway feel that we can safely defer the C1 esterase concentrate.  On reassessment patient swelling does seem to be mildly improved.  Has not had any progression.  No tongue swelling or voice change.  Will continue to monitor.  Signed out to oncoming rider pending additional observation.       FINAL CLINICAL IMPRESSION(S) / ED DIAGNOSES   Final diagnoses:  None     Rx / DC Orders   ED Discharge Orders     None        Note:  This document was prepared using Dragon voice recognition software and may include unintentional dictation errors.   Georga Hacking, MD 11/08/22 [phone]

## 2022-11-13 ENCOUNTER — Encounter: Payer: Self-pay | Admitting: Family Medicine

## 2022-11-13 ENCOUNTER — Ambulatory Visit (INDEPENDENT_AMBULATORY_CARE_PROVIDER_SITE_OTHER): Payer: Managed Care, Other (non HMO) | Admitting: Family Medicine

## 2022-11-13 VITALS — BP 119/73 | HR 97 | Temp 98.1°F | Resp 15 | Ht 60.0 in | Wt 146.7 lb

## 2022-11-13 DIAGNOSIS — Z87898 Personal history of other specified conditions: Secondary | ICD-10-CM | POA: Diagnosis not present

## 2022-11-13 MED ORDER — EPINEPHRINE 0.3 MG/0.3ML IJ SOAJ: 0.3000 mg | INTRAMUSCULAR | 1 refills | Status: AC | PRN

## 2022-11-13 NOTE — Progress Notes (Signed)
I,Tina  Atkinson,acting as a Neurosurgeon for [employer], DO.,have documented all relevant documentation on the behalf of [employer], DO,as directed by  [employer], DO while in the presence of Sherlyn Hay, DO.    Established patient visit   Patient: Tina Atkinson   DOB: Apr 04, 1971   52 y.o. Female  MRN: [id number] Visit Date: 11/13/2022  Today's healthcare provider: Sherlyn Hay, DO   Chief Complaint  Patient presents with   Follow-up   Subjective    HPI  Follow up ER visit  Patient was seen in ER for lip swelling on 11/08/2022. She was treated for angioedema. Treatment for this included famotidine, benadryl, methylprednisolone and epipen; she was discharged with a prescription for 4 days of prednisone and instructions to take OTC zyrtec for one week. She reports excellent compliance with treatment. She reports this condition is Improved.  She is unsure what predicated her lip swelling.  She notes that she had [place] food a couple of days before, including a dish that did have grabbing it, but she notes it may have been imitation crab.  The day just prior to her ER visit, she had half of a BLT sandwich and half of a ham sandwich.  She does note that she had gone to a walk-in clinic a few months back with some swelling on the right side of her face; she had sinus pain at that time as well.  She was treated with Zithromax, prednisone, and hydrocodone at that visit.  Her symptoms have remained improved since her ER visit.  She has a visit with the allergist scheduled for 11/27/2022.  -----------------------------------------------------------------------------------------   Medications: Outpatient Medications Prior to Visit  Medication Sig   triamcinolone ointment (KENALOG) 0.5 % Apply 1 Application topically 2 (two) times daily.   No facility-administered medications prior to visit.    Review of Systems  HENT:  Negative for dental problem, drooling, facial  swelling, trouble swallowing and voice change.   Respiratory:  Negative for chest tightness, shortness of breath, wheezing and stridor.   Gastrointestinal:  Negative for abdominal distention and abdominal pain.       Objective    LMP 09/17/2016 (Within Days)    Physical Exam Vitals reviewed.  Constitutional:      General: She is not in acute distress.    Appearance: Normal appearance. She is well-developed. She is not diaphoretic.  HENT:     Head: Normocephalic and atraumatic.     Mouth/Throat:     Mouth: Mucous membranes are moist.     Pharynx: Oropharynx is clear.  Eyes:     General: No scleral icterus.    Conjunctiva/sclera: Conjunctivae normal.  Neck:     Thyroid: No thyromegaly.  Cardiovascular:     Rate and Rhythm: Normal rate and regular rhythm.     Pulses: Normal pulses.     Heart sounds: Normal heart sounds. No murmur heard. Pulmonary:     Effort: Pulmonary effort is normal. No respiratory distress.     Breath sounds: Normal breath sounds. No wheezing, rhonchi or rales.  Abdominal:     General: Bowel sounds are normal.  Skin:    General: Skin is warm and dry.     Findings: No rash.  Neurological:     Mental Status: She is alert and oriented to person, place, and time. Mental status is at baseline.  Psychiatric:        Mood and Affect: Mood normal.  Behavior: Behavior normal.      No results found for any visits on 11/13/22.  Assessment & Plan     1. History of angioedema Patient noted that she had gone to a walk-in clinic a few months back with some swelling on the right side of her face; she had sinus pain at that time as well.  Looking in records, it seems that this visit, and the swelling, were more likely related to a fractured tooth than a similar allergic reaction.  Patient did show a picture of her angioedema.  Given the significant swelling of the patient's mouth and the lack of knowledge regarding what precipitated it, prescribed EpiPen as  noted below and instructed patient on use.  Advised her on proper storage as well.  Strongly encouraged patient to appointment with allergist currently scheduled for 11/27/2022. - EPINEPHrine (EPIPEN 2-PAK) 0.3 mg/0.3 mL IJ SOAJ injection; Inject 0.3 mg into the muscle as needed for anaphylaxis.  Dispense: 2 each; Refill: 1   No follow-ups on file.      The entirety of the information documented in the History of Present Illness, Review of Systems and Physical Exam were personally obtained by me. Portions of this information were initially documented by the CMA, Erie Noe Atkinson, and reviewed by me for thoroughness and accuracy.    I discussed the assessment and treatment plan with the patient  The patient was provided an opportunity to ask questions and all were answered. The patient agreed with the plan and demonstrated an understanding of the instructions.   The patient was advised to call back or seek an in-person evaluation if the symptoms worsen or if the condition fails to improve as anticipated.    Sherlyn Hay, DO  [facility] [place] [phone] (phone) [phone] (fax)  [facility]

## 2022-11-13 NOTE — Progress Notes (Addendum)
Transferred information to note identfied as being authored by provider.

## 2022-11-27 ENCOUNTER — Other Ambulatory Visit: Payer: Self-pay

## 2022-11-27 ENCOUNTER — Encounter: Payer: Self-pay | Admitting: Allergy and Immunology

## 2022-11-27 ENCOUNTER — Ambulatory Visit (INDEPENDENT_AMBULATORY_CARE_PROVIDER_SITE_OTHER): Payer: Managed Care, Other (non HMO) | Admitting: Allergy and Immunology

## 2022-11-27 ENCOUNTER — Ambulatory Visit
Admission: RE | Admit: 2022-11-27 | Discharge: 2022-11-27 | Disposition: A | Discharge status: Home / Self Care | Payer: Managed Care, Other (non HMO) | Source: Ambulatory Visit | Attending: Allergy and Immunology | Admitting: Allergy and Immunology

## 2022-11-27 ENCOUNTER — Telehealth: Payer: Self-pay | Admitting: *Deleted

## 2022-11-27 VITALS — BP 120/70 | HR 125 | Temp 98.3°F | Resp 18 | Ht 60.0 in | Wt 143.1 lb

## 2022-11-27 DIAGNOSIS — L500 Allergic urticaria: Secondary | ICD-10-CM

## 2022-11-27 DIAGNOSIS — K047 Periapical abscess without sinus: Secondary | ICD-10-CM | POA: Diagnosis not present

## 2022-11-27 DIAGNOSIS — T783XXD Angioneurotic edema, subsequent encounter: Secondary | ICD-10-CM

## 2022-11-27 MED ORDER — FAMOTIDINE 10 MG PO TABS: 10.0000 mg | ORAL_TABLET | Freq: Two times a day (BID) | ORAL | 5 refills | Status: AC

## 2022-11-27 MED ORDER — CEFDINIR 300 MG PO CAPS: 300.0000 mg | ORAL_CAPSULE | Freq: Two times a day (BID) | ORAL | 0 refills | Status: AC

## 2022-11-27 MED ORDER — CETIRIZINE HCL 10 MG PO TABS: 10.0000 mg | ORAL_TABLET | Freq: Every day | ORAL | 5 refills | Status: DC

## 2022-11-27 NOTE — Telephone Encounter (Signed)
An order was placed for CT Maxillofacial with [facility]. Called and spoke with patients insurance and they did stated that a prior authorization was not required Reference [id number]. I called and spoke with [facility] and provided reference number, they were able to schedule her for a CT Scan today at 4:00 at the [facility] location located at [address] [facility] needing to arrive 3:40. I called the patient and advised of the appointment and provided the address. Patient verbalized understanding and will be there for the imaging. Once results are in I do need to fax the results and office notes to [employer] to ensure insurance coverage for medical necessity.

## 2022-11-27 NOTE — Patient Instructions (Addendum)
  1. Allergen avoidance measures???  2. Every day use the following:   A. Cetirizine 10 mg - 1 tablet 1 time per day  B. Famotidine 10 mg - 1 tablet 1 time per day  3. Treat possible infection: Omnicef 300 mg - 1 tablet 2 times per day for 10 days  4. If needed:   A. Epi-pen / Auvi-Q 0.3, benadryl, MD/ER evaluation for allergic reaction  5. Obtain sinus CT scan TODAY for unilateral face swelling  6. Obtain blood - CBC w/d, CMP, C4, C1-esterase inhibitor and function, nut panel w/R  7. Fix right upper molar defect  8. Further evaluation and treatment???

## 2022-11-27 NOTE — Progress Notes (Signed)
[place] - [place] - [place] - [place] - [place]   Dear Dr. Beryle Flock,  Thank you for referring Tina Atkinson to the [facility] [place] on 11/27/2022.   Below is a summation of this patient's evaluation and recommendations.  Thank you for your referral. I will keep you informed about this patient's response to treatment.   If you have any questions please do not hesitate to contact me.   Sincerely,  Jessica Priest, MD Allergy / Immunology [facility]   ______________________________________________________________________    NEW PATIENT NOTE  Referring Provider: Erasmo Downer, MD Primary Provider: Erasmo Downer, MD Date of office visit: 11/27/2022    Subjective:   Chief Complaint:  Tina Atkinson (DOB: [birth year]) is a 52 y.o. female who presents to the clinic on 11/27/2022 with a chief complaint of Angioedema and Allergic Reaction .     HPI: Tina Atkinson presents to this clinic in evaluation of 3 issues.  First, she had a "allergic reaction" 08 Nov 2022 around 1 AM in the morning when she had itchy face and then swelling of her face requiring her to go to the emergency room around 4 AM in the morning and received epinephrine and various other medications.  Within 8 hours over swelling resolved.  There was no obvious provoking factor giving rise to this issue however she did eat pistachios around 8 PM that night..  She did not take any new supplements or health food or vitamin or any over-the-counter medication and she has not started any new medications and she did not have a change in her environment.  Since that point in time she has had 2 red swollen areas develop.  1 occurred on her left arm and 1 occurred on her right arm.  Each 1 lasted about a day and had no other associated systemic or constitutional symptoms.  Her last episode was 5 days ago.  She took some Benadryl  cream and some Benadryl for these reactions.  Second, she apparently and has been developing problems with "sinus".  She states that she has had 2 episodes of right-sided facial swelling that have been a persistent issue lasting multiple days for which she sought out care in the urgent care center in January 2024 and was treated with prednisone and an antibiotic assuming that she had an infection from her tooth.  Her most recent episode of right-sided facial swelling started 3 days ago.  Third, for over a year she has been having problems with her right upper molar.  Apparently the enamel came off.  Past Medical History:  Diagnosis Date   Angio-edema     History reviewed. No pertinent surgical history.  Allergies as of 11/27/2022       Reactions   Penicillin G Hives        Medication List    cefdinir 300 MG capsule Commonly known as: OMNICEF Take 1 capsule (300 mg total) by mouth 2 (two) times daily for 10 days. Started by: Jessica Priest, MD   cetirizine 10 MG tablet Commonly known as: ZYRTEC Take 1 tablet (10 mg total) by mouth daily. Started by: Jessica Priest, MD   EPINEPHrine 0.3 mg/0.3 mL Soaj injection Commonly known as: EpiPen 2-Pak Inject 0.3 mg into the muscle as needed for anaphylaxis.   famotidine 10 MG tablet Commonly known as: PEPCID Take 1 tablet (10 mg total) by mouth 2 (two) times daily. Started  by: Jessica Priest, MD   multivitamin capsule Take 1 capsule by mouth daily.    Review of systems negative except as noted in HPI / PMHx or noted below:  Review of Systems  Constitutional: Negative.   HENT: Negative.    Eyes: Negative.   Respiratory: Negative.    Cardiovascular: Negative.   Gastrointestinal: Negative.   Genitourinary: Negative.   Musculoskeletal: Negative.   Skin: Negative.   Neurological: Negative.   Endo/Heme/Allergies: Negative.   Psychiatric/Behavioral: Negative.      Family History  Problem Relation Age of Onset   Diabetes  Mother    Osteoporosis Mother    Asthma Father    Hyperlipidemia Father    Allergic rhinitis Brother    Cancer Brother    Diabetes Maternal Grandmother    Diabetes Maternal Grandfather    Pancreatic cancer Maternal Grandfather    Breast cancer Neg Hx     Social History   Socioeconomic History   Marital status: Single    Spouse name: Not on file   Number of children: Not on file   Years of education: Not on file   Highest education level: Associate degree: occupational, Scientist, product/process development, or vocational program  Occupational History   Not on file  Tobacco Use   Smoking status: Never    Passive exposure: Never   Smokeless tobacco: Never  Vaping Use   Vaping Use: Never used  Substance and Sexual Activity   Alcohol use: Never   Drug use: Never   Sexual activity: Not on file  Other Topics Concern   Not on file  Social History Narrative   Not on file   Social Determinants of Health   Financial Resource Strain: Low Risk  (11/09/2022)   Overall Financial Resource Strain (CARDIA)    Difficulty of Paying Living Expenses: Not hard at all  Food Insecurity: No Food Insecurity (11/09/2022)   Hunger Vital Sign    Worried About Running Out of Food in the Last Year: Never true    Ran Out of Food in the Last Year: Never true  Transportation Needs: No Transportation Needs (11/09/2022)   PRAPARE - Administrator, Civil Service (Medical): No    Lack of Transportation (Non-Medical): No  Physical Activity: Insufficiently Active (11/09/2022)   Exercise Vital Sign    Days of Exercise per Week: 2 days    Minutes of Exercise per Session: 30 min  Stress: No Stress Concern Present (11/09/2022)   Tina Atkinson of Occupational Health - Occupational Stress Questionnaire    Feeling of Stress : Only a little  Social Connections: Socially Isolated (11/09/2022)   Social Connection and Isolation Panel [NHANES]    Frequency of Communication with Friends and Family: Once a week    Frequency of  Social Gatherings with Friends and Family: Once a week    Attends Religious Services: More than 4 times per year    Active Member of [employer] or Organizations: No    Attends Engineer, structural: Not on file    Marital Status: Never married  Intimate Partner Violence: Not on Scientist, research (life sciences) and Social history  Lives in a house with a dry environment, no animals located inside the household, carpet in the bedroom, no plastic on the bed, no plastic on the pillow, no smoking ongoing inside the household.  She works in an office setting.  Objective:   Vitals:   11/27/22 0935  BP: 120/70  Pulse: (!) 125  Resp: 18  Temp: 98.3 F (36.8 C)  SpO2: 97%   Height: 5' (152.4 cm) Weight: 143 lb 1.6 oz (64.9 kg)  Physical Exam Constitutional:      Appearance: She is not diaphoretic.  HENT:     Head: Normocephalic.     Comments: Right facial swelling    Right Ear: Tympanic membrane, ear canal and external ear normal.     Left Ear: Tympanic membrane, ear canal and external ear normal.     Nose: Nose normal. No mucosal edema or rhinorrhea.     Mouth/Throat:     Pharynx: Uvula midline. No oropharyngeal exudate.     Comments: Right decaying rear upper molar Eyes:     Conjunctiva/sclera: Conjunctivae normal.  Neck:     Thyroid: No thyromegaly.     Trachea: Trachea normal. No tracheal tenderness or tracheal deviation.  Cardiovascular:     Rate and Rhythm: Normal rate and regular rhythm.     Heart sounds: Normal heart sounds, S1 normal and S2 normal. No murmur heard. Pulmonary:     Effort: No respiratory distress.     Breath sounds: Normal breath sounds. No stridor. No wheezing or rales.  Lymphadenopathy:     Head:     Right side of head: No tonsillar adenopathy.     Left side of head: No tonsillar adenopathy.     Cervical: No cervical adenopathy.  Skin:    Findings: No erythema or rash.     Nails: There is no clubbing.  Neurological:     Mental Status: She is alert.      Diagnostics: Allergy skin tests were performed.  She had diffuse dermatographia.  Assessment and Plan:    1. Angioedema, subsequent encounter   2. Allergic urticaria   3. Tooth infection    1. Allergen avoidance measures???  2. Every day use the following:   A. Cetirizine 10 mg - 1 tablet 1 time per day  B. Famotidine 10 mg - 1 tablet 1 time per day  3. Treat possible infection: Omnicef 300 mg - 1 tablet 2 times per day for 10 days  4. If needed:   A. Epi-pen / Auvi-Q 0.3, benadryl, MD/ER evaluation for allergic reaction  5. Obtain sinus CT scan TODAY for unilateral face swelling  6. Obtain blood - CBC w/d, CMP, C4, C1-esterase inhibitor and function, nut panel w/R  7. Fix right upper molar defect  8. Further evaluation and treatment???  Lalena appears to have an overactive immune system giving rise to recurrent episodes of angioedema and urticaria and we will keep her on an H1 and H2 receptor blocker as noted above and evaluate her blood for systemic disease contributing to this issue including looking at the possibility the pistachio is one of the triggers giving rise to this reaction.  And she appears to have an infected right upper molar and we will give her a broad-spectrum antibiotic and she has very significant swelling and we need to evaluate for the possibility of sinus extension with a sinus CT scan which we will get arranged for today.  Jessica Priest, MD Allergy / Immunology [facility]

## 2022-11-28 ENCOUNTER — Telehealth: Payer: Self-pay | Admitting: *Deleted

## 2022-11-28 LAB — CBC WITH DIFFERENTIAL
Basophils Absolute: 0 10*3/uL (ref 0.0–0.2)
Basos: 0 %
EOS (ABSOLUTE): 0 10*3/uL (ref 0.0–0.4)
Eos: 0 %
Hematocrit: 42.4 % (ref 34.0–46.6)
Hemoglobin: 14.4 g/dL (ref 11.1–15.9)
Immature Grans (Abs): 0 10*3/uL (ref 0.0–0.1)
Immature Granulocytes: 0 %
Lymphocytes Absolute: 2 10*3/uL (ref 0.7–3.1)
Lymphs: 21 %
MCH: 30.3 pg (ref 26.6–33.0)
MCHC: 34 g/dL (ref 31.5–35.7)
MCV: 89 fL (ref 79–97)
Monocytes Absolute: 0.6 10*3/uL (ref 0.1–0.9)
Monocytes: 6 %
Neutrophils Absolute: 7.1 10*3/uL — ABNORMAL HIGH (ref 1.4–7.0)
Neutrophils: 73 %
RBC: 4.75 x10E6/uL (ref 3.77–5.28)
RDW: 12.3 % (ref 11.7–15.4)
WBC: 9.8 10*3/uL (ref 3.4–10.8)

## 2022-11-28 LAB — COMPREHENSIVE METABOLIC PANEL
ALT: 31 IU/L (ref 0–32)
AST: 27 IU/L (ref 0–40)
Albumin/Globulin Ratio: 1.6
Albumin: 4.7 g/dL (ref 3.8–4.9)
Alkaline Phosphatase: 83 IU/L (ref 44–121)
BUN/Creatinine Ratio: 12 (ref 9–23)
BUN: 10 mg/dL (ref 6–24)
Bilirubin Total: 0.9 mg/dL (ref 0.0–1.2)
CO2: 26 mmol/L (ref 20–29)
Calcium: 10.6 mg/dL — ABNORMAL HIGH (ref 8.7–10.2)
Chloride: 96 mmol/L (ref 96–106)
Creatinine, Ser: 0.83 mg/dL (ref 0.57–1.00)
Globulin, Total: 2.9 g/dL (ref 1.5–4.5)
Glucose: 89 mg/dL (ref 70–99)
Potassium: 4.6 mmol/L (ref 3.5–5.2)
Sodium: 138 mmol/L (ref 134–144)
Total Protein: 7.6 g/dL (ref 6.0–8.5)
eGFR: 85 mL/min/{1.73_m2} (ref >59)

## 2022-11-28 LAB — C4 COMPLEMENT: Complement C4, Serum: 29 mg/dL (ref 12–38)

## 2022-11-28 NOTE — Telephone Encounter (Signed)
Received fax back from insurance stating that the prior authorization has been approved for the CT Scan. Approval has been labeled and placed in bulk scanning.

## 2022-11-28 NOTE — Telephone Encounter (Signed)
Kozlow, Alvira Philips, MD  [facility] Please let Tina Atkinson know that she has a extended infection from her upper molar on the right and it needs to be removed asap. Please have her search a oral surgeon and have this tooth removed. And she has a chronic sinus infection right maxillary sinus and we need for her to see ENT. I think [facility] has openings for new patients within a week or so.   Can we please refer her to [facility] as soon as possible? If there is another that can get her in sooner she will be fine with that. She lives in [place].

## 2022-11-28 NOTE — Telephone Encounter (Signed)
Imaging results and office notes have been faxed to [employer] for insurance cover. Forms have been faxed to [phone] and have been placed in the pending tray for now.

## 2022-11-30 LAB — IGE NUT PROF. W/COMPONENT RFLX
F017-IgE Hazelnut (Filbert): <0.1 kU/L
F018-IgE Brazil Nut: <0.1 kU/L
F020-IgE Almond: <0.1 kU/L
F202-IgE Cashew Nut: <0.1 kU/L
F203-IgE Pistachio Nut: <0.1 kU/L
F256-IgE Walnut: <0.1 kU/L
Macadamia Nut, IgE: <0.1 kU/L
Peanut, IgE: <0.1 kU/L
Pecan Nut IgE: <0.1 kU/L

## 2022-11-30 LAB — C1 ESTERASE INHIBITOR, FUNCTIONAL: C1INH Functional/C1INH Total MFr SerPl: >110 %mean normal

## 2022-11-30 NOTE — Telephone Encounter (Signed)
I called and spoke with the Referral coordinator [facility] and she requested me to send the referral urgent via email. She is going to contact the patient after their staff meeting today.   I spoke with [place] and she agrees to contact me if she is not scheduled by today or Monday. Mychart message sent with their contact information.

## 2023-03-25 ENCOUNTER — Other Ambulatory Visit: Payer: Self-pay | Admitting: Family Medicine

## 2023-03-25 DIAGNOSIS — Z1231 Encounter for screening mammogram for malignant neoplasm of breast: Secondary | ICD-10-CM

## 2023-05-09 ENCOUNTER — Ambulatory Visit
Admission: RE | Admit: 2023-05-09 | Discharge: 2023-05-09 | Disposition: A | Discharge status: Home / Self Care | Payer: Managed Care, Other (non HMO) | Source: Ambulatory Visit | Attending: Family Medicine | Admitting: Family Medicine

## 2023-05-09 DIAGNOSIS — Z1231 Encounter for screening mammogram for malignant neoplasm of breast: Secondary | ICD-10-CM | POA: Diagnosis present

## 2023-05-27 ENCOUNTER — Other Ambulatory Visit: Payer: Self-pay | Admitting: Allergy and Immunology

## 2023-07-22 ENCOUNTER — Other Ambulatory Visit: Payer: Self-pay

## 2023-11-08 ENCOUNTER — Other Ambulatory Visit: Payer: Self-pay | Admitting: Allergy and Immunology

## 2024-01-09 ENCOUNTER — Other Ambulatory Visit: Payer: Self-pay | Admitting: Allergy and Immunology

## 2024-02-06 ENCOUNTER — Other Ambulatory Visit: Payer: Self-pay | Admitting: Allergy and Immunology

## 2024-04-06 ENCOUNTER — Encounter: Payer: Self-pay | Admitting: Family Medicine

## 2024-04-06 ENCOUNTER — Other Ambulatory Visit: Payer: Self-pay | Admitting: Family Medicine

## 2024-04-06 DIAGNOSIS — Z1231 Encounter for screening mammogram for malignant neoplasm of breast: Secondary | ICD-10-CM

## 2024-04-28 ENCOUNTER — Encounter: Payer: Self-pay | Admitting: Family Medicine

## 2024-04-28 ENCOUNTER — Ambulatory Visit: Admitting: Family Medicine

## 2024-04-28 VITALS — BP 130/73 | HR 64 | Ht 59.0 in | Wt 147.9 lb

## 2024-04-28 DIAGNOSIS — H04123 Dry eye syndrome of bilateral lacrimal glands: Secondary | ICD-10-CM

## 2024-04-28 DIAGNOSIS — Z789 Other specified health status: Secondary | ICD-10-CM

## 2024-04-28 DIAGNOSIS — Z23 Encounter for immunization: Secondary | ICD-10-CM | POA: Diagnosis not present

## 2024-04-28 DIAGNOSIS — Z0000 Encounter for general adult medical examination without abnormal findings: Secondary | ICD-10-CM

## 2024-04-28 DIAGNOSIS — R7303 Prediabetes: Secondary | ICD-10-CM

## 2024-04-28 DIAGNOSIS — Z1211 Encounter for screening for malignant neoplasm of colon: Secondary | ICD-10-CM | POA: Diagnosis not present

## 2024-04-28 NOTE — Progress Notes (Signed)
 Complete physical exam   Patient: Tina Atkinson   DOB: 05/25/1971   53 y.o. Female  MRN: [id number] Visit Date: 04/28/2024  Today's healthcare provider: Jon Eva, MD   Chief Complaint  Patient presents with   Annual Exam    Last completed 06/26/22 Diet -  General consuming at least 2 meals daily Exercise - none Feeling - well Sleeping - fairly well Concerns -  was prescribed generic zyrtec  and wanted to know if it is something she should be taking everyday or just when needed, dry eyes X months and has been seen at [facility] in and was prescribed drops for conjunctivitis and did not help   Subjective    Tina Atkinson is a 53 y.o. female who presents today for a complete physical exam.    Discussed the use of AI scribe software for clinical note transcription with the patient, who gave verbal consent to proceed.  History of Present Illness   Tina Atkinson is a 53 year old female who presents for an annual physical exam and evaluation of dry eyes.  She experiences dry eyes for several months, with symptoms of dryness and redness. She uses generic artificial tears throughout the day. Previously, she was prescribed medication for conjunctivitis, which was discontinued in favor of artificial tears, initially improving symptoms. However, dryness and redness have returned.  Her screen time is approximately eight hours daily, which may contribute to reduced blinking and dry eyes. She stays hydrated by drinking water regularly and suspects she might sleep with her eyes slightly open, potentially worsening dryness.  She uses Zyrtec  as needed, which can contribute to dryness, and avoids Visine or similar products for red eyes.        Last depression screening scores    11/13/2022    4:00 PM 06/26/2022    9:19 AM 06/26/2022    9:18 AM  PHQ 2/9 Scores  PHQ - 2 Score 0 0 0  PHQ- 9 Score 0  0       Data saved with a previous flowsheet row definition   Last fall risk  screening    11/13/2022    4:00 PM  Fall Risk   Falls in the past year? 0  Number falls in past yr: 0  Injury with Fall? 0  Risk for fall due to : No Fall Risks  Follow up Falls evaluation completed        Medications: Outpatient Medications Prior to Visit  Medication Sig   cetirizine  (ZYRTEC ) 10 MG tablet TAKE 1 TABLET BY MOUTH EVERY DAY   EPINEPHrine  (EPIPEN  2-PAK) 0.3 mg/0.3 mL IJ SOAJ injection Inject 0.3 mg into the muscle as needed for anaphylaxis.   famotidine  (PEPCID ) 10 MG tablet Take 1 tablet (10 mg total) by mouth 2 (two) times daily.   Multiple Vitamin (MULTIVITAMIN) capsule Take 1 capsule by mouth daily.   No facility-administered medications prior to visit.    Review of Systems    Objective    BP 130/73 (BP Location: Left Arm, Patient Position: Sitting, Cuff Size: Normal)   Pulse 64   Ht 4' 11 (1.499 m)   Wt 147 lb 14.4 oz (67.1 kg)   LMP 09/17/2016 (Within Days)   SpO2 99%   BMI 29.87 kg/m    Physical Exam Vitals reviewed.  Constitutional:      General: She is not in acute distress.    Appearance: Normal appearance. She is well-developed. She is not diaphoretic.  HENT:  Head: Normocephalic and atraumatic.     Right Ear: Tympanic membrane, ear canal and external ear normal.     Left Ear: Tympanic membrane, ear canal and external ear normal.     Nose: Nose normal.     Mouth/Throat:     Mouth: Mucous membranes are moist.     Pharynx: Oropharynx is clear. No oropharyngeal exudate.  Eyes:     Conjunctiva/sclera:     Right eye: Right conjunctiva is injected.     Left eye: Left conjunctiva is injected.     Pupils: Pupils are equal, round, and reactive to light.  Neck:     Thyroid: No thyromegaly.  Cardiovascular:     Rate and Rhythm: Normal rate and regular rhythm.     Heart sounds: Normal heart sounds. No murmur heard. Pulmonary:     Effort: Pulmonary effort is normal. No respiratory distress.     Breath sounds: Normal breath sounds. No  wheezing or rales.  Abdominal:     General: There is no distension.     Palpations: Abdomen is soft.     Tenderness: There is no abdominal tenderness.  Musculoskeletal:        General: No deformity.     Cervical back: Neck supple.     Right lower leg: No edema.     Left lower leg: No edema.  Lymphadenopathy:     Cervical: No cervical adenopathy.  Skin:    General: Skin is warm and dry.     Findings: No rash.  Neurological:     Mental Status: She is alert and oriented to person, place, and time. Mental status is at baseline.     Gait: Gait normal.  Psychiatric:        Mood and Affect: Mood normal.        Behavior: Behavior normal.        Thought Content: Thought content normal.      No results found for any visits on 04/28/24.  Assessment & Plan    Routine Health Maintenance and Physical Exam  Exercise Activities and Dietary recommendations  Goals   None     Immunization History  Administered Date(s) Administered   DTaP 05/05/1970, 02/07/1971, 04/10/1973   Dtap, Unspecified 05/05/1970, 02/07/1971, 04/10/1973   Fluzone Influenza virus vaccine,trivalent (IIV3), split virus 07/03/2011   Influenza Inj Mdck Quad Pf 04/20/2022   Influenza, Seasonal, Injecte, Preservative Fre 06/24/2009, 04/28/2024   Influenza,inj,Quad PF,6+ Mos 06/23/2012, 05/03/2017, 02/24/2021   Influenza-Unspecified 03/28/2022   Janssen (J&J) SARS-COV-2 Vaccination 09/24/2019   Moderna Covid-19 Fall Seasonal Vaccine 8yrs & older 04/20/2022   Moderna Covid-19 Vaccine Bivalent Booster 53yrs & up 03/21/2021   Moderna SARS-COV2 Booster Vaccination 04/12/2020, 03/21/2021   Mumps 06/29/1974   OPV 05/05/1970, 02/04/1971, 04/07/1973, 06/29/1974   Pfizer(Comirnaty)Fall Seasonal Vaccine 12 years and older 04/20/2022   Polio, Unspecified 05/05/1970, 02/04/1971, 04/10/1973, 06/29/1974   Rubella 06/29/1974   Zoster Recombinant(Shingrix ) 02/24/2021, 04/27/2021    Health Maintenance  Topic Date Due    DTaP/Tdap/Td (4 - Tdap) 02/20/1989   Hepatitis B Vaccines 19-59 Average Risk (1 of 3 - 19+ 3-dose series) Never done   Pneumococcal Vaccine: 50+ Years (1 of 1 - PCV) Never done   COVID-19 Vaccine (6 - 2025-26 season) 02/17/2024   Fecal DNA (Cologuard)  03/06/2024   Mammogram  05/08/2025   Cervical Cancer Screening (HPV/Pap Cotest)  02/24/2026   Influenza Vaccine  Completed   Hepatitis C Screening  Completed   HIV Screening  Completed  Zoster Vaccines- Shingrix   Completed   HPV VACCINES  Aged Out   Meningococcal B Vaccine  Aged Out    Discussed health benefits of physical activity, and encouraged her to engage in regular exercise appropriate for her age and condition.  Problem List Items Addressed This Visit       Other   Prediabetes   Relevant Orders   Hemoglobin A1c   Lipid panel   Comprehensive metabolic panel with GFR   Other Visit Diagnoses       Encounter for annual physical exam    -  Primary   Relevant Orders   Hepatitis B Surface AntiBODY   Hemoglobin A1c   Lipid panel   Comprehensive metabolic panel with GFR     Immunization due       Relevant Orders   Flu vaccine trivalent PF, 6mos and older(Flulaval,Afluria,Fluarix,Fluzone) (Completed)     Screening for colon cancer       Relevant Orders   Cologuard     Hepatitis B vaccination status unknown       Relevant Orders   Hepatitis B Surface AntiBODY     Bilateral dry eyes               Adult Wellness Visit Routine adult wellness visit with emphasis on cancer screenings and vaccinations. - Ordered Cologuard for colorectal cancer screening - Confirmed mammogram appointment for later this month - Scheduled next year's physical exam  Dry eye syndrome, bilateral Bilateral dry eye syndrome with diffuse redness and dryness, exacerbated by screen time and possibly Zyrtec  use. No signs of infection or conjunctivitis. Artificial tears recommended. Consider eyelid closure during sleep as a contributing factor. -  Continue using artificial tears - Avoid Zyrtec  unless necessary - Follow up with [facility] if symptoms persist - Consider using an eye mask to keep eyelids closed during sleep  Prediabetes Previous A1c in the prediabetic range. Monitoring required to assess current status. - Ordered A1c test to assess current blood sugar levels  General Health Maintenance Discussion of vaccinations including tetanus, pneumonia, and hepatitis B. Tetanus booster recommended every ten years. Pneumonia vaccine recommended due to increased risk in younger populations. Hepatitis B immunity status to be assessed via blood test. - Consider tetanus booster - Consider pneumonia vaccine - Ordered hepatitis B immunity test       Return in about 1 year (around 04/28/2025) for CPE.     Jon Eva, MD  [facility] [phone] (phone) [phone] (fax)  [facility]

## 2024-04-29 LAB — COMPREHENSIVE METABOLIC PANEL WITH GFR
ALT: 42 IU/L — ABNORMAL HIGH (ref 0–32)
AST: 29 IU/L (ref 0–40)
Albumin: 4.8 g/dL (ref 3.8–4.9)
Alkaline Phosphatase: 68 IU/L (ref 49–135)
BUN/Creatinine Ratio: 8 — ABNORMAL LOW (ref 9–23)
BUN: 7 mg/dL (ref 6–24)
Bilirubin Total: 0.4 mg/dL (ref 0.0–1.2)
CO2: 27 mmol/L (ref 20–29)
Calcium: 10.7 mg/dL — ABNORMAL HIGH (ref 8.7–10.2)
Chloride: 98 mmol/L (ref 96–106)
Creatinine, Ser: 0.92 mg/dL (ref 0.57–1.00)
Globulin, Total: 2.7 g/dL (ref 1.5–4.5)
Glucose: 87 mg/dL (ref 70–99)
Potassium: 4.7 mmol/L (ref 3.5–5.2)
Sodium: 138 mmol/L (ref 134–144)
Total Protein: 7.5 g/dL (ref 6.0–8.5)
eGFR: 74 mL/min/1.73 (ref >59)

## 2024-04-29 LAB — LIPID PANEL
Chol/HDL Ratio: 5 ratio — ABNORMAL HIGH (ref 0.0–4.4)
Cholesterol, Total: 248 mg/dL — ABNORMAL HIGH (ref 100–199)
HDL: 50 mg/dL (ref >39)
LDL Chol Calc (NIH): 159 mg/dL — ABNORMAL HIGH (ref 0–99)
Triglycerides: 215 mg/dL — ABNORMAL HIGH (ref 0–149)
VLDL Cholesterol Cal: 39 mg/dL (ref 5–40)

## 2024-04-29 LAB — HEMOGLOBIN A1C
Est. average glucose Bld gHb Est-mCnc: 117 mg/dL
Hgb A1c MFr Bld: 5.7 % — ABNORMAL HIGH (ref 4.8–5.6)

## 2024-04-29 LAB — HEPATITIS B SURFACE ANTIBODY,QUALITATIVE: Hep B Surface Ab, Qual: NONREACTIVE

## 2024-04-30 ENCOUNTER — Ambulatory Visit: Payer: Self-pay | Admitting: Family Medicine

## 2024-05-09 LAB — COLOGUARD: COLOGUARD: NEGATIVE

## 2024-05-11 ENCOUNTER — Ambulatory Visit
Admission: RE | Admit: 2024-05-11 | Discharge: 2024-05-11 | Disposition: A | Discharge status: Home / Self Care | Source: Ambulatory Visit | Attending: Family Medicine | Admitting: Family Medicine

## 2024-05-11 DIAGNOSIS — Z1231 Encounter for screening mammogram for malignant neoplasm of breast: Secondary | ICD-10-CM | POA: Diagnosis present

## 2024-05-18 ENCOUNTER — Ambulatory Visit: Payer: Self-pay | Admitting: Family Medicine

## 2024-05-22 ENCOUNTER — Encounter: Payer: Self-pay | Admitting: Family Medicine

## 2025-04-29 ENCOUNTER — Ambulatory Visit: Admitting: Family Medicine
